# Patient Record
Sex: Female | Born: 1990 | Race: Black or African American | Hispanic: No | Smoking: Never smoker
Health system: Southern US, Community
[De-identification: ages and names within clinical notes are randomized; demographics above are authoritative.]

## PROBLEM LIST (undated history)

## (undated) ENCOUNTER — Inpatient Hospital Stay (HOSPITAL_COMMUNITY): Payer: Self-pay

## (undated) DIAGNOSIS — Z3490 Encounter for supervision of normal pregnancy, unspecified, unspecified trimester: Secondary | ICD-10-CM

## (undated) DIAGNOSIS — D649 Anemia, unspecified: Secondary | ICD-10-CM

## (undated) DIAGNOSIS — Z789 Other specified health status: Secondary | ICD-10-CM

## (undated) DIAGNOSIS — H9319 Tinnitus, unspecified ear: Secondary | ICD-10-CM

## (undated) DIAGNOSIS — F32A Depression, unspecified: Secondary | ICD-10-CM

## (undated) DIAGNOSIS — F419 Anxiety disorder, unspecified: Secondary | ICD-10-CM

## (undated) DIAGNOSIS — F329 Major depressive disorder, single episode, unspecified: Secondary | ICD-10-CM

## (undated) HISTORY — DX: Depression, unspecified: F32.A

## (undated) HISTORY — DX: Tinnitus, unspecified ear: H93.19

## (undated) HISTORY — PX: OTHER SURGICAL HISTORY: SHX169

## (undated) HISTORY — DX: Anxiety disorder, unspecified: F41.9

## (undated) HISTORY — PX: DILATION AND CURETTAGE OF UTERUS: SHX78

## (undated) HISTORY — DX: Anemia, unspecified: D64.9

## (undated) HISTORY — DX: Other specified health status: Z78.9

---
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
## 2012-07-09 ENCOUNTER — Emergency Department (HOSPITAL_COMMUNITY)
Admission: EM | Admit: 2012-07-09 | Discharge: 2012-07-09 | Disposition: A | Discharge status: Home / Self Care | Attending: Emergency Medicine | Admitting: Emergency Medicine

## 2012-07-09 ENCOUNTER — Encounter (HOSPITAL_COMMUNITY): Payer: Self-pay | Admitting: Physical Medicine and Rehabilitation

## 2012-07-09 DIAGNOSIS — Z3202 Encounter for pregnancy test, result negative: Secondary | ICD-10-CM | POA: Insufficient documentation

## 2012-07-09 DIAGNOSIS — R112 Nausea with vomiting, unspecified: Secondary | ICD-10-CM | POA: Insufficient documentation

## 2012-07-09 DIAGNOSIS — K529 Noninfective gastroenteritis and colitis, unspecified: Secondary | ICD-10-CM

## 2012-07-09 DIAGNOSIS — R197 Diarrhea, unspecified: Secondary | ICD-10-CM | POA: Insufficient documentation

## 2012-07-09 DIAGNOSIS — K5289 Other specified noninfective gastroenteritis and colitis: Secondary | ICD-10-CM | POA: Insufficient documentation

## 2012-07-09 LAB — URINALYSIS, ROUTINE W REFLEX MICROSCOPIC
Bilirubin Urine: NEGATIVE
Glucose, UA: NEGATIVE mg/dL
Ketones, ur: NEGATIVE mg/dL
Nitrite: NEGATIVE
pH: 5 (ref 5.0–8.0)

## 2012-07-09 LAB — CBC WITH DIFFERENTIAL/PLATELET
Basophils Relative: 0 % (ref 0–1)
Eosinophils Absolute: 0.1 10*3/uL (ref 0.0–0.7)
Eosinophils Relative: 1 % (ref 0–5)
HCT: 37.4 % (ref 36.0–46.0)
Hemoglobin: 13.3 g/dL (ref 12.0–15.0)
MCH: 30.5 pg (ref 26.0–34.0)
MCHC: 35.6 g/dL (ref 30.0–36.0)
Monocytes Absolute: 0.4 10*3/uL (ref 0.1–1.0)
Monocytes Relative: 6 % (ref 3–12)

## 2012-07-09 LAB — COMPREHENSIVE METABOLIC PANEL
Albumin: 3.8 g/dL (ref 3.5–5.2)
BUN: 10 mg/dL (ref 6–23)
Creatinine, Ser: 0.76 mg/dL (ref 0.50–1.10)
Total Protein: 7.4 g/dL (ref 6.0–8.3)

## 2012-07-09 LAB — LIPASE, BLOOD: Lipase: 79 U/L — ABNORMAL HIGH (ref 11–59)

## 2012-07-09 LAB — POCT PREGNANCY, URINE: Preg Test, Ur: NEGATIVE

## 2012-07-09 MED ORDER — ONDANSETRON 4 MG PO TBDP
8.0000 mg | ORAL_TABLET | Freq: Once | ORAL | Status: AC
  Administered 2012-07-09: 8 mg via ORAL
  Filled 2012-07-09: qty 2

## 2012-07-09 MED ORDER — DIPHENOXYLATE-ATROPINE 2.5-0.025 MG PO TABS: 1.0000 | ORAL_TABLET | Freq: Four times a day (QID) | ORAL | Status: DC | PRN

## 2012-07-09 MED ORDER — DIPHENOXYLATE-ATROPINE 2.5-0.025 MG PO TABS
1.0000 | ORAL_TABLET | Freq: Once | ORAL | Status: AC
  Administered 2012-07-09: 1 via ORAL
  Filled 2012-07-09: qty 1

## 2012-07-09 MED ORDER — PROMETHAZINE HCL 25 MG PO TABS: 25.0000 mg | ORAL_TABLET | Freq: Four times a day (QID) | ORAL | Status: DC | PRN

## 2012-07-09 NOTE — ED Notes (Signed)
Discharge instructions and medications reviewed. Pt verbalized understanding.  

## 2012-07-09 NOTE — ED Provider Notes (Addendum)
History     CSN: [id number]  Arrival date & time 07/09/12  1405   First MD Initiated Contact with Patient 07/09/12 1521      Chief Complaint  Patient presents with  . Nausea  . Emesis  . Abdominal Pain    (Consider location/radiation/quality/duration/timing/severity/associated sxs/prior treatment) HPI...... nausea vomiting and diarrhea for 12 hours.  Minimal lower abdominal pain. Not sexually active. Severity is mild. Patient is able to keep fluids down. No fever, dysuria, right lower quadrant tenderness..  nothing makes symptoms better or worse  No past medical history on file.  No past surgical history on file.  No family history on file.  History  Substance Use Topics  . Smoking status: Never Smoker   . Smokeless tobacco: Not on file  . Alcohol Use: No    OB History   Grav Para Term Preterm Abortions TAB SAB Ect Mult Living                  Review of Systems  All other systems reviewed and are negative.    Allergies  Review of patient's allergies indicates no known allergies.  Home Medications   Current Outpatient Rx  Name  Route  Sig  Dispense  Refill  . diphenoxylate-atropine (LOMOTIL) 2.5-0.025 MG per tablet   Oral   Take 1 tablet by mouth 4 (four) times daily as needed for diarrhea or loose stools.   15 tablet   0   . promethazine (PHENERGAN) 25 MG tablet   Oral   Take 1 tablet (25 mg total) by mouth every 6 (six) hours as needed for nausea.   15 tablet   0     BP 129/89  Pulse 95  Temp(Src) 99.8 F (37.7 C) (Oral)  Resp 18  SpO2 99%  Physical Exam  Nursing note and vitals reviewed. Constitutional: She is oriented to person, place, and time. She appears well-developed and well-nourished.  Patient does not appear ill  HENT:  Head: Normocephalic and atraumatic.  Eyes: Conjunctivae and EOM are normal. Pupils are equal, round, and reactive to light.  Neck: Normal range of motion. Neck supple.  Cardiovascular: Normal rate, regular  rhythm and normal heart sounds.   Pulmonary/Chest: Effort normal and breath sounds normal.  Abdominal: Soft. Bowel sounds are normal.  Musculoskeletal: Normal range of motion.  Neurological: She is alert and oriented to person, place, and time.  Skin: Skin is warm and dry.  Psychiatric: She has a normal mood and affect.    ED Course  Procedures (including critical care time)  Labs Reviewed  COMPREHENSIVE METABOLIC PANEL - Abnormal; Notable for the following:    Glucose, Bld 103 (*)    All other components within normal limits  LIPASE, BLOOD - Abnormal; Notable for the following:    Lipase 79 (*)    All other components within normal limits  URINALYSIS, ROUTINE W REFLEX MICROSCOPIC  CBC WITH DIFFERENTIAL  POCT PREGNANCY, URINE   No results found.   1. Gastroenteritis       MDM  Patient has normal vital signs. She does not appear ill. Abdomen is soft. Urinalysis and pregnancy test negative.  Slight elevation of lipase noted. She is a nondrinker. No epigastric tenderness.  Discharge meds Phenergan 25 mg #15 and Lomotil #15        Donnetta Hutching, MD 07/09/12 1627  Donnetta Hutching, MD 07/09/12 1627

## 2012-07-09 NOTE — ED Notes (Signed)
Pt presents to department for evaluation of lower abdominal pain and N/V/D. Onset this morning. 3/10 pain at the time. Last BM this morning was runny. Pt is conscious alert and oriented x4.

## 2012-07-09 NOTE — ED Notes (Signed)
Pt c/o lower abd pain, vomiting and diarrhea that started last night possibly after eating chinese food. Dr. Adriana Simas at bedside.

## 2013-06-06 DIAGNOSIS — S52209G Unspecified fracture of shaft of unspecified ulna, subsequent encounter for closed fracture with delayed healing: Secondary | ICD-10-CM | POA: Insufficient documentation

## 2013-11-29 ENCOUNTER — Ambulatory Visit (INDEPENDENT_AMBULATORY_CARE_PROVIDER_SITE_OTHER): Admitting: Obstetrics & Gynecology

## 2013-11-29 ENCOUNTER — Encounter: Payer: Self-pay | Admitting: Obstetrics & Gynecology

## 2013-11-29 VITALS — BP 132/82 | HR 79 | Temp 98.5°F | Ht 63.0 in | Wt 176.0 lb

## 2013-11-29 DIAGNOSIS — Z3401 Encounter for supervision of normal first pregnancy, first trimester: Secondary | ICD-10-CM

## 2013-11-29 DIAGNOSIS — Z3201 Encounter for pregnancy test, result positive: Secondary | ICD-10-CM

## 2013-11-29 DIAGNOSIS — Z3400 Encounter for supervision of normal first pregnancy, unspecified trimester: Secondary | ICD-10-CM | POA: Insufficient documentation

## 2013-11-29 NOTE — Patient Instructions (Signed)
AFP Maternal This is a routine screen (tests) used to check for fetal abnormalities such as Down syndrome and neural tube defects. Down syndrome is a chromosomal abnormality, sometimes called Trisomy 73. Neural tube defects are serious birth defects. The brain, spinal cord, or their coverings do not develop completely. Women should be tested in the 15th to 20th week of pregnancy. The msAFP screen involves three or four tests that measure substances found in the blood that make the testing better. During development, AFP levels in fetal blood and amniotic fluid rise until about 12 weeks. The levels then gradually fall until birth. AFP is a protein produce by fetal tissue. AFP crosses the placenta and appears in the maternal blood. A baby with an open neural tube defect has an opening in its spine, head, or abdominal wall that allows higher than usual amounts of AFP to pass into the mother's blood. If a screen is positive, more tests are needed to make a diagnosis. These include ultrasound and perhaps amniocentesis (checking the fluid that surrounds the baby). These tests are used to help women and their caregivers make decisions about the management of their pregnancies. In pregnancies where the fetus is carrying the chromosomal defect that results in Down syndrome, the levels of AFP and unconjugated estriol tend to be low and hCG and inhibin A levels high.  PREPARATION FOR TEST Blood is drawn from a vein in your arm usually between the 15th and 20th weeks of pregnancy. Four different tests on your blood are done. These are AFP, hCG, unconjugated estriol, and inhibin A. The combination of tests produces a more accurate result. NORMAL FINDINGS   Adult: less than 40 ng/mL or less than 40 mg/L (SI units).  Child younger than 1 year: less than 30 ng/mL. Ranges are stratified by weeks of gestation and vary among laboratories. Ranges for normal findings may vary among different laboratories and hospitals. You  should always check with your doctor after having lab work or other tests done to discuss the meaning of your test results and whether your values are considered within normal limits. MEANING OF TEST  These are screening tests. Not all fetal abnormalities will give positive test results. Of all women who have positive AFP screening results, only a very small number of them have babies who actually have a neural tube defect or chromosomal abnormality. Your caregiver will go over the test results with you and discuss the importance and meaning of your results, as well as treatment options and the need for additional tests if necessary. OBTAINING THE TEST RESULTS It is your responsibility to obtain your test results. Ask the lab or department performing the test when and how you will get your results. Document Released: 04/07/2004 Document Revised: 07/31/2013 Document Reviewed: 02/18/2008 [facility] Patient Information 2015 [place], [place]. This information is not intended to replace advice given to you by your health care provider. Make sure you discuss any questions you have with your health care provider. Pregnancy, The Father's Role A father has an important role during their partners pregnancy, labor, delivery and afterward. It is important to help and support your partner through this new period. There are many physical and emotional changes that happen. To be helpful and supportive during this time, you should know and understand what is happening to your partner during the pregnancy, labor, delivery and postpartum period.  PREGNANCY Pregnancy lasts 40 weeks (plus or minus 2 weeks). The pregnancy is divided into three trimesters.   In the first  13 weeks, the mother feels tired, has painful breasts, may feel sick to her stomach (nauseated), throw up (vomit), urinates more often and may have mood changes. All of these changes are normal. If the father is aware of these, he can be more helpful, supportive  and understanding. This may include helping with household duties and activities and spending more time with each other.  In the next 14 to 28 weeks, your partner is over the tiredness, nausea and vomiting. She will likely feel better and more energetic. This is the best time of the pregnancy to be more active together, go out more often or take trips. You will be able to see her belly popping out with the pregnancy. You may be able to feel the baby kick.  In the last 12 weeks, she may become more uncomfortable again because her abdomen is popping out more as the baby grows. She may have a hard time doing household chores, her balance may be off, she may have a hard time bending over, tires easily and has a tough time sleeping. At this time, you will realize the birth of your baby is close. You and your partner may have concerns about the safety of your partner and if the baby will be normal and healthy. These are all normal and natural feelings. You should talk with each other and your caregiver if you have any questions. Attend prenatal care visits with your partner. This is a good time for you to get to know your caregiver, follow the pregnancy and ask questions. Prenatal visits are once a month for 6 months, then they are every 2 weeks for 2 months and then once a week the last month. You may have more prenatal visits if your caregiver feels it is needed. Your caregiver usually does an ultrasound of the baby at one of the prenatal visits or more often if needed. It is an exciting and emotional to see the baby moving and the heart beating.  Fathers can experience emotional changes during this time as well. These emotions can include happiness, excitement and feeling proud. Fathers may also be concerned about having new responsibilities. These include financial, educational and if it will change the relationship with his partner. These feelings are normal. They should be talked about openly and positively  with each other. An important and often asked question is if sexual intercourse is safe during pregnancy and if it will harm the baby. Sexual intercourse is safe unless there is a problem with the pregnancy and your caregiver advises you to not have sexual intercourse. Because physical and emotional changes happen in pregnancy, your partner may not want to have sex during certain times. This is mostly true in the first and third trimesters. Trying different positions may make sexual intercourse comfortable. It is important for the both of you to discuss your feelings and desires with this problem. Talk to your caregiver about any questions you have about sexual intercourse during the pregnancy. LABOR AND DELIVERY There are childbirth classes available for couples to take together. They help you understand what happens during labor and delivery. They also teach you how to help your partner with her labor pains, how to relax, breath properly during a contraction and focus on what is happening during labor. You may be asked to time the contractions, massage her back and breath with her during the contractions. You are also there to see and enjoy the excitement your baby being born. If you have any  feelings of fainting or are uncomfortable, tell someone to help you. You may be asked to leave the room if a problem develops during the labor or delivery. Sometimes a Cesarean Section (C-section) is scheduled or is an emergency during labor and delivery. A C-section is a major operation to deliver the baby. It is done through an incision in the abdomen and uterus. Your partner will be given a medicine to make her sleep (general anesthesia) or spinal anesthesia (numbing the body from the waist down). Most hospitals allow the father in the room for a C-section unless it is an emergency. Recovery from a C-section takes longer, is more uncomfortable and will require more help from the father. AFTER DELIVERY After the baby  is born, the mother goes through many changes again. These changes could last 4 to 6 weeks or longer following a C-section. It is not unusual to be anxious, concerned and afraid that you may not be taking care of your newborn baby properly. Your partner may take a while to regain her strength. She may also get feelings of sadness (postpartum blues or depression), which is a more serious condition that may require medical treatment.  Your partner may decide to breastfeed the baby. This helps with bonding between the mother and the baby. Breastfeeding is the best way to feed the baby, but you may feel "left out." However, you can feel included by burping the baby and bottle feeding the baby with breast milk (collected by the mother) to give your partner some rest. This also helps you to bond with the baby. Breastfeeding mothers can get pregnant even if they are not having menstrual periods. Therefore, some form of birth control should be used if you do not want to get pregnant. Another question and concern is when it is safe to have sexual intercourse again. Usually it takes 4 to 6 weeks for healing to be over with. It may take longer after a C-section. If you have any questions about having sexual intercourse or if it is painful, talk to your caregiver. As a father, you will be adjusting your role as the baby grows. Fatherhood is a on-going [employer]. You and your partner should still make time to be together alone and be the couple you were before the baby was born. This is helpful for you, your partner and your baby. As you can see, it is important for a father to be helpful, understanding and supportive during this special time. Document Released: 09/02/2007 Document Revised: 06/08/2011 Document Reviewed: 09/02/2007 [facility] Patient Information 2015 [place], [place]. This information is not intended to replace advice given to you by your health care provider. Make sure you discuss any questions you  have with your health care provider. Second Trimester of Pregnancy The second trimester is from week 13 through week 28, months 4 through 6. The second trimester is often a time when you feel your best. Your body has also adjusted to being pregnant, and you begin to feel better physically. Usually, morning sickness has lessened or quit completely, you may have more energy, and you may have an increase in appetite. The second trimester is also a time when the fetus is growing rapidly. At the end of the sixth month, the fetus is about 9 inches long and weighs about 1 pounds. You will likely begin to feel the baby move (quickening) between 18 and 20 weeks of the pregnancy. BODY CHANGES Your body goes through many changes during pregnancy. The changes vary  from woman to woman.   Your weight will continue to increase. You will notice your lower abdomen bulging out.  You may begin to get stretch marks on your hips, abdomen, and breasts.  You may develop headaches that can be relieved by medicines approved by your health care provider.  You may urinate more often because the fetus is pressing on your bladder.  You may develop or continue to have heartburn as a result of your pregnancy.  You may develop constipation because certain hormones are causing the muscles that push waste through your intestines to slow down.  You may develop hemorrhoids or swollen, bulging veins (varicose veins).  You may have back pain because of the weight gain and pregnancy hormones relaxing your joints between the bones in your pelvis and as a result of a shift in weight and the muscles that support your balance.  Your breasts will continue to grow and be tender.  Your gums may bleed and may be sensitive to brushing and flossing.  Dark spots or blotches (chloasma, mask of pregnancy) may develop on your face. This will likely fade after the baby is born.  A dark line from your belly button to the pubic area (linea  nigra) may appear. This will likely fade after the baby is born.  You may have changes in your hair. These can include thickening of your hair, rapid growth, and changes in texture. Some women also have hair loss during or after pregnancy, or hair that feels dry or thin. Your hair will most likely return to normal after your baby is born. WHAT TO EXPECT AT YOUR PRENATAL VISITS During a routine prenatal visit:  You will be weighed to make sure you and the fetus are growing normally.  Your blood pressure will be taken.  Your abdomen will be measured to track your baby's growth.  The fetal heartbeat will be listened to.  Any test results from the previous visit will be discussed. Your health care provider may ask you:  How you are feeling.  If you are feeling the baby move.  If you have had any abnormal symptoms, such as leaking fluid, bleeding, severe headaches, or abdominal cramping.  If you have any questions. Other tests that may be performed during your second trimester include:  Blood tests that check for:  Low iron levels (anemia).  Gestational diabetes (between 24 and 28 weeks).  Rh antibodies.  Urine tests to check for infections, diabetes, or protein in the urine.  An ultrasound to confirm the proper growth and development of the baby.  An amniocentesis to check for possible genetic problems.  Fetal screens for spina bifida and Down syndrome. HOME CARE INSTRUCTIONS   Avoid all smoking, herbs, alcohol, and unprescribed drugs. These chemicals affect the formation and growth of the baby.  Follow your health care provider's instructions regarding medicine use. There are medicines that are either safe or unsafe to take during pregnancy.  Exercise only as directed by your health care provider. Experiencing uterine cramps is a good sign to stop exercising.  Continue to eat regular, healthy meals.  Wear a good support bra for breast tenderness.  Do not use hot  tubs, steam rooms, or saunas.  Wear your seat belt at all times when driving.  Avoid raw meat, uncooked cheese, cat litter boxes, and soil used by cats. These carry germs that can cause birth defects in the baby.  Take your prenatal vitamins.  Try taking a stool softener (if your  health care provider approves) if you develop constipation. Eat more high-fiber foods, such as fresh vegetables or fruit and whole grains. Drink plenty of fluids to keep your urine clear or pale yellow.  Take warm sitz baths to soothe any pain or discomfort caused by hemorrhoids. Use hemorrhoid cream if your health care provider approves.  If you develop varicose veins, wear support hose. Elevate your feet for 15 minutes, 3-4 times a day. Limit salt in your diet.  Avoid heavy lifting, wear low heel shoes, and practice good posture.  Rest with your legs elevated if you have leg cramps or low back pain.  Visit your dentist if you have not gone yet during your pregnancy. Use a soft toothbrush to brush your teeth and be gentle when you floss.  A sexual relationship may be continued unless your health care provider directs you otherwise.  Continue to go to all your prenatal visits as directed by your health care provider. SEEK MEDICAL CARE IF:   You have dizziness.  You have mild pelvic cramps, pelvic pressure, or nagging pain in the abdominal area.  You have persistent nausea, vomiting, or diarrhea.  You have a bad smelling vaginal discharge.  You have pain with urination. SEEK IMMEDIATE MEDICAL CARE IF:   You have a fever.  You are leaking fluid from your vagina.  You have spotting or bleeding from your vagina.  You have severe abdominal cramping or pain.  You have rapid weight gain or loss.  You have shortness of breath with chest pain.  You notice sudden or extreme swelling of your face, hands, ankles, feet, or legs.  You have not felt your baby move in over an hour.  You have severe  headaches that do not go away with medicine.  You have vision changes. Document Released: 03/10/2001 Document Revised: 03/21/2013 Document Reviewed: 05/17/2012 [facility] Patient Information 2015 [place], [place]. This information is not intended to replace advice given to you by your health care provider. Make sure you discuss any questions you have with your health care provider.

## 2013-11-29 NOTE — Progress Notes (Signed)
Subjective:    Tina [name] is being seen today for her first obstetrical visit.  This is not a planned pregnancy. She is at [redacted]w[redacted]d gestation. Relationship with FOB: significant other, not living together. Patient does intend to breast feed. Pregnancy history fully reviewed.  The information documented in the HPI was reviewed and verified.  Menstrual History: OB History   Grav Para Term Preterm Abortions TAB SAB Ect Mult Living   2    1            Patient's last menstrual period was 09/09/2013.    Past Medical History  Diagnosis Date  . Medical history non-contributory     Past Surgical History  Procedure Laterality Date  . Nasal cyst removal    . Right arm surgery      Rod and screws placed     (Not in a hospital admission) No Known Allergies  History  Substance Use Topics  . Smoking status: Never Smoker   . Smokeless tobacco: Never Used  . Alcohol Use: No    Family History  Problem Relation Age of Onset  . Cancer Mother   . Diabetes Father   . Hypertension Father      Review of Systems Constitutional: negative for weight loss Gastrointestinal: negative for vomiting Genitourinary:negative for genital lesions and vaginal discharge and dysuria Musculoskeletal:negative for back pain Behavioral/Psych: negative for abusive relationship, depression, illegal drug usage and tobacco use    Objective:    BP 132/82  Pulse 79  Temp(Src) 98.5 F (36.9 C)  Ht  (1.6 m)  Wt 79.833 kg (176 lb)  BMI 31.18 kg/m2  LMP 09/09/2013 General Appearance:    Alert, cooperative, no distress, appears stated age  Head:    Normocephalic, without obvious abnormality, atraumatic  Eyes:    PERRL, conjunctiva/corneas clear, EOM's intact, fundi    benign, both eyes  Ears:    Normal TM's and external ear canals, both ears  Nose:   Nares normal, septum midline, mucosa normal, no drainage    or sinus tenderness  Throat:   Lips, mucosa, and tongue normal; teeth and gums normal  Neck:    Supple, symmetrical, trachea midline, no adenopathy;    thyroid:  no enlargement/tenderness/nodules; no carotid   bruit or JVD  Back:     Symmetric, no curvature, ROM normal, no CVA tenderness  Lungs:     Clear to auscultation bilaterally, respirations unlabored  Chest Wall:    No tenderness or deformity   Heart:    Regular rate and rhythm, S1 and S2 normal, no murmur, rub   or gallop  Breast Exam:    No tenderness, masses, or nipple abnormality  Abdomen:     Soft, non-tender, bowel sounds active all four quadrants,    no masses, no organomegaly  Genitalia:    Normal female without lesion, discharge or tenderness  Extremities:   Extremities normal, atraumatic, no cyanosis or edema  Pulses:   2+ and symmetric all extremities  Skin:   Skin color, texture, turgor normal, no rashes or lesions  Lymph nodes:   Cervical, supraclavicular, and axillary nodes normal  Neurologic:   CNII-XII intact, normal strength, sensation and reflexes    throughout      Lab Review Urine pregnancy test Labs reviewed no Radiologic studies reviewed no Assessment:    Pregnancy at [redacted]w[redacted]d weeks    Plan:      Prenatal vitamins.  Counseling provided regarding continued use of seat belts, cessation of alcohol  consumption, smoking or use of illicit drugs; infection precautions i.e., influenza/TDAP immunizations, toxoplasmosis,CMV, parvovirus, listeria and varicella; workplace safety, exercise during pregnancy; routine dental care, safe medications, sexual activity, hot tubs, saunas, pools, travel, caffeine use, fish and methlymercury, potential toxins, hair treatments, varicose veins Weight gain recommendations per IOM guidelines reviewed: obese/BMI >30->gain  11 - 20 lbs Problem list reviewed and updated. FIRST/CF mutation testing/NIPT/QUAD SCREEN/fragile X/Spinal muscular atrophy discussed Role of ultrasound in pregnancy discussed Amniocentesis discussed: not indicated.  Meds ordered this encounter   Medications  . Prenatal Multivit-Min-Fe-FA (PRENATAL VITAMINS PO)    Sig: Take by mouth.   Orders Placed This Encounter  Procedures  . Culture, OB Urine  . Obstetric panel  . HIV antibody  . Hemoglobinopathy evaluation  . Varicella zoster antibody, IgG  . Vit D  25 hydroxy (rtn osteoporosis monitoring)  . TSH  . POCT urinalysis dipstick    Follow up in 4 weeks.

## 2013-11-30 LAB — OBSTETRIC PANEL
ANTIBODY SCREEN: NEGATIVE
BASOS ABS: 0 10*3/uL (ref 0.0–0.1)
Basophils Relative: 0 % (ref 0–1)
EOS ABS: 0 10*3/uL (ref 0.0–0.7)
EOS PCT: 0 % (ref 0–5)
HEMATOCRIT: 37.8 % (ref 36.0–46.0)
Hemoglobin: 12.5 g/dL (ref 12.0–15.0)
Hepatitis B Surface Ag: NEGATIVE
LYMPHS ABS: 1.6 10*3/uL (ref 0.7–4.0)
LYMPHS PCT: 12 % (ref 12–46)
MCH: 29.3 pg (ref 26.0–34.0)
MCHC: 33.1 g/dL (ref 30.0–36.0)
MCV: 88.5 fL (ref 78.0–100.0)
MONO ABS: 0.7 10*3/uL (ref 0.1–1.0)
Monocytes Relative: 5 % (ref 3–12)
Neutro Abs: 11.2 10*3/uL — ABNORMAL HIGH (ref 1.7–7.7)
Neutrophils Relative %: 83 % — ABNORMAL HIGH (ref 43–77)
Platelets: 275 10*3/uL (ref 150–400)
RBC: 4.27 MIL/uL (ref 3.87–5.11)
RDW: 13.1 % (ref 11.5–15.5)
RUBELLA: 6.07 {index} — AB (ref <0.90)
Rh Type: POSITIVE
WBC: 13.5 10*3/uL — AB (ref 4.0–10.5)

## 2013-11-30 LAB — WET PREP BY MOLECULAR PROBE
CANDIDA SPECIES: NEGATIVE
Gardnerella vaginalis: POSITIVE — AB
Trichomonas vaginosis: NEGATIVE

## 2013-11-30 LAB — VITAMIN D 25 HYDROXY (VIT D DEFICIENCY, FRACTURES): VIT D 25 HYDROXY: 27 ng/mL — AB (ref 30–89)

## 2013-11-30 LAB — TSH: TSH: 0.444 u[IU]/mL (ref 0.350–4.500)

## 2013-11-30 LAB — HEMOGLOBIN A1C
Hgb A1c MFr Bld: 5.5 % (ref <5.7)
MEAN PLASMA GLUCOSE: 111 mg/dL (ref <117)

## 2013-11-30 LAB — HIV ANTIBODY (ROUTINE TESTING W REFLEX): HIV: NONREACTIVE

## 2013-11-30 LAB — VARICELLA ZOSTER ANTIBODY, IGG: Varicella IgG: 2507 Index — ABNORMAL HIGH (ref <135.00)

## 2013-12-01 LAB — PAP IG AND CT-NG NAA
CHLAMYDIA PROBE AMP: NEGATIVE
GC PROBE AMP: NEGATIVE

## 2013-12-01 LAB — HEMOGLOBINOPATHY EVALUATION
HGB A: 96.9 % (ref 96.8–97.8)
Hemoglobin Other: 0 %
Hgb A2 Quant: 2.5 % (ref 2.2–3.2)
Hgb F Quant: 0.6 % (ref 0.0–2.0)
Hgb S Quant: 0 %

## 2013-12-02 ENCOUNTER — Encounter: Payer: Self-pay | Admitting: Obstetrics & Gynecology

## 2013-12-02 DIAGNOSIS — IMO0002 Reserved for concepts with insufficient information to code with codable children: Secondary | ICD-10-CM | POA: Insufficient documentation

## 2013-12-02 NOTE — Progress Notes (Signed)
Quick Note:  Needs PNV w/vitamin D. Inform her of pap result. If last pap was normal-->repeat in 1 yr. If last pap was abnormal-->colpo post partum ______

## 2013-12-20 ENCOUNTER — Encounter: Payer: Self-pay | Admitting: *Deleted

## 2013-12-21 ENCOUNTER — Telehealth: Payer: Self-pay | Admitting: *Deleted

## 2013-12-21 ENCOUNTER — Other Ambulatory Visit: Payer: Self-pay | Admitting: *Deleted

## 2013-12-21 DIAGNOSIS — B9689 Other specified bacterial agents as the cause of diseases classified elsewhere: Secondary | ICD-10-CM

## 2013-12-21 DIAGNOSIS — N760 Acute vaginitis: Principal | ICD-10-CM

## 2013-12-21 MED ORDER — METRONIDAZOLE 500 MG PO TABS: 500.0000 mg | ORAL_TABLET | Freq: Two times a day (BID) | ORAL | Status: DC

## 2013-12-21 NOTE — Telephone Encounter (Signed)
Patient was in the office to pick up letter- she states that in the evening she feels her heart racing. This is the only time that she feels this- at no other time in the day. She states she can slow it most of the time I she concentrates. Do we need a baseline EKG on her?

## 2013-12-23 NOTE — Telephone Encounter (Signed)
Yes.  Along w/TSH, free T4 and BMET

## 2013-12-26 NOTE — Telephone Encounter (Signed)
Patient notified of response. She has an appointment in the office tomorrow and will speak to the doctor about testing that should be done.

## 2013-12-27 ENCOUNTER — Encounter: Payer: Self-pay | Admitting: Obstetrics & Gynecology

## 2013-12-27 ENCOUNTER — Telehealth: Payer: Self-pay

## 2013-12-27 ENCOUNTER — Encounter: Admitting: Obstetrics & Gynecology

## 2013-12-27 ENCOUNTER — Ambulatory Visit (INDEPENDENT_AMBULATORY_CARE_PROVIDER_SITE_OTHER): Admitting: Obstetrics & Gynecology

## 2013-12-27 VITALS — BP 120/65 | HR 82 | Temp 98.4°F | Wt 178.0 lb

## 2013-12-27 DIAGNOSIS — Z3400 Encounter for supervision of normal first pregnancy, unspecified trimester: Secondary | ICD-10-CM

## 2013-12-27 DIAGNOSIS — Z3402 Encounter for supervision of normal first pregnancy, second trimester: Secondary | ICD-10-CM

## 2013-12-27 LAB — POCT URINALYSIS DIPSTICK
Bilirubin, UA: NEGATIVE
Blood, UA: NEGATIVE
Glucose, UA: NEGATIVE
Ketones, UA: NEGATIVE
LEUKOCYTES UA: NEGATIVE
NITRITE UA: NEGATIVE
PH UA: 7
PROTEIN UA: NEGATIVE
Spec Grav, UA: 1.01
UROBILINOGEN UA: NEGATIVE

## 2013-12-27 NOTE — Telephone Encounter (Signed)
[facility] at 10am

## 2013-12-28 LAB — URINE CULTURE
Colony Count: NO GROWTH
Organism ID, Bacteria: NO GROWTH

## 2013-12-28 LAB — BASIC METABOLIC PANEL
BUN: 6 mg/dL (ref 6–23)
CALCIUM: 8.8 mg/dL (ref 8.4–10.5)
CO2: 24 mEq/L (ref 19–32)
CREATININE: 0.62 mg/dL (ref 0.50–1.10)
Chloride: 104 mEq/L (ref 96–112)
GLUCOSE: 63 mg/dL — AB (ref 70–99)
POTASSIUM: 3.7 meq/L (ref 3.5–5.3)
Sodium: 133 mEq/L — ABNORMAL LOW (ref 135–145)

## 2013-12-29 NOTE — Patient Instructions (Signed)
AFP Maternal This is a routine screen (tests) used to check for fetal abnormalities such as Down syndrome and neural tube defects. Down syndrome is a chromosomal abnormality, sometimes called Trisomy 21. Neural tube defects are serious birth defects. The brain, spinal cord, or their coverings do not develop completely. Women should be tested in the 15th to 20th week of pregnancy. The msAFP screen involves three or four tests that measure substances found in the blood that make the testing better. During development, AFP levels in fetal blood and amniotic fluid rise until about 12 weeks. The levels then gradually fall until birth. AFP is a protein produce by fetal tissue. AFP crosses the placenta and appears in the maternal blood. A baby with an open neural tube defect has an opening in its spine, head, or abdominal wall that allows higher than usual amounts of AFP to pass into the mother's blood. If a screen is positive, more tests are needed to make a diagnosis. These include ultrasound and perhaps amniocentesis (checking the fluid that surrounds the baby). These tests are used to help women and their caregivers make decisions about the management of their pregnancies. In pregnancies where the fetus is carrying the chromosomal defect that results in Down syndrome, the levels of AFP and unconjugated estriol tend to be low and hCG and inhibin A levels high.  PREPARATION FOR TEST Blood is drawn from a vein in your arm usually between the 15th and 20th weeks of pregnancy. Four different tests on your blood are done. These are AFP, hCG, unconjugated estriol, and inhibin A. The combination of tests produces a more accurate result. NORMAL FINDINGS   Adult: less than 40 ng/mL or less than 40 mg/L (SI units).  Child younger than 1 year: less than 30 ng/mL. Ranges are stratified by weeks of gestation and vary among laboratories. Ranges for normal findings may vary among different laboratories and hospitals. You  should always check with your doctor after having lab work or other tests done to discuss the meaning of your test results and whether your values are considered within normal limits. MEANING OF TEST  These are screening tests. Not all fetal abnormalities will give positive test results. Of all women who have positive AFP screening results, only a very small number of them have babies who actually have a neural tube defect or chromosomal abnormality. Your caregiver will go over the test results with you and discuss the importance and meaning of your results, as well as treatment options and the need for additional tests if necessary. OBTAINING THE TEST RESULTS It is your responsibility to obtain your test results. Ask the lab or department performing the test when and how you will get your results. Document Released: 04/07/2004 Document Revised: 07/31/2013 Document Reviewed: 02/18/2008 ExitCare Patient Information 2015 [employer]. This information is not intended to replace advice given to you by your health care provider. Make sure you discuss any questions you have with your health care provider.  

## 2013-12-29 NOTE — Progress Notes (Signed)
  Subjective:    Tina ForestCharlise [name] is a 23 y.o. female being seen today for her obstetrical visit. She is at 640w4d gestation. Patient reports: no complaints.  Problem List Items Addressed This Visit     Unprioritized   Supervision of normal first pregnancy - Primary   Relevant Orders      POCT urinalysis dipstick (Completed)      Urine culture (Completed)      Basic Metabolic Panel (BMET) (Completed)      EKG 12-Lead      US OB Comp + 14 Wk     Patient Active Problem List   Diagnosis Date Noted  . ASCUS favor benign 12/02/2013  . Supervision of normal first pregnancy 11/29/2013    Objective:     BP 120/65  Pulse 82  Temp(Src) 98.4 F (36.9 C)  Wt 80.74 kg (178 lb)  LMP 09/09/2013 Uterine Size: Below umbilicus     Assessment:    Pregnancy @ 6040w4d weeks Doing well    Plan:    Problem list reviewed and updated. Labs reviewed.  Follow up in 4 weeks. CF mutation testing/NIPT/QUAD SCREEN/Ashkenazi Jewish population testing/Spinal muscular atrophy discussed. Role of ultrasound in pregnancy discussed. Amniocentesis discussed: not indicated.

## 2014-01-16 ENCOUNTER — Other Ambulatory Visit: Payer: Self-pay | Admitting: *Deleted

## 2014-01-16 ENCOUNTER — Ambulatory Visit (INDEPENDENT_AMBULATORY_CARE_PROVIDER_SITE_OTHER)

## 2014-01-16 DIAGNOSIS — Z3402 Encounter for supervision of normal first pregnancy, second trimester: Secondary | ICD-10-CM

## 2014-01-16 DIAGNOSIS — B379 Candidiasis, unspecified: Secondary | ICD-10-CM

## 2014-01-16 DIAGNOSIS — T3695XA Adverse effect of unspecified systemic antibiotic, initial encounter: Principal | ICD-10-CM

## 2014-01-16 LAB — US OB COMP + 14 WK

## 2014-01-16 MED ORDER — TERCONAZOLE 0.4 % VA CREA: 1.0000 | TOPICAL_CREAM | Freq: Every day | VAGINAL | Status: DC

## 2014-01-25 ENCOUNTER — Ambulatory Visit (INDEPENDENT_AMBULATORY_CARE_PROVIDER_SITE_OTHER): Admitting: Obstetrics & Gynecology

## 2014-01-25 ENCOUNTER — Other Ambulatory Visit: Payer: Self-pay | Admitting: Obstetrics & Gynecology

## 2014-01-25 ENCOUNTER — Encounter: Payer: Self-pay | Admitting: Obstetrics & Gynecology

## 2014-01-25 VITALS — BP 125/79 | HR 93 | Wt 183.0 lb

## 2014-01-25 DIAGNOSIS — Z3402 Encounter for supervision of normal first pregnancy, second trimester: Secondary | ICD-10-CM

## 2014-01-25 LAB — POCT URINALYSIS DIPSTICK
BILIRUBIN UA: NEGATIVE
GLUCOSE UA: NEGATIVE
Ketones, UA: NEGATIVE
Leukocytes, UA: NEGATIVE
Nitrite, UA: NEGATIVE
Protein, UA: NEGATIVE
RBC UA: NEGATIVE
SPEC GRAV UA: 1.02
Urobilinogen, UA: NEGATIVE
pH, UA: 5

## 2014-01-26 NOTE — Progress Notes (Signed)
Subjective:    Tina [name] is a 23 y.o. female being seen today for her obstetrical visit. She is at [redacted]w[redacted]d gestation. Patient reports: no complaints . Fetal movement: normal.  Problem List Items Addressed This Visit   None    Visit Diagnoses   Encounter for supervision of normal first pregnancy in second trimester    -  Primary    Relevant Orders       AFP, Quad Screen       Cystic fibrosis diagnostic study       Miscellaneous test      Patient Active Problem List   Diagnosis Date Noted  . ASCUS favor benign 12/02/2013  . Supervision of normal first pregnancy 11/29/2013   Objective:    BP 125/79  Pulse 93  Wt 83.008 kg (183 lb)  LMP 09/09/2013 FHT: 160 BPM  Uterine Size: size equals dates     Assessment:    Pregnancy @ 655w5d    Plan:    Signs and symptoms of preterm labor: discussed.  Orders Placed This Encounter  Procedures  . AFP, Quad Screen    Order Specific Question:  Repeat Sample    Answer:  No    Order Specific Question:  Maternal Race    Answer:  African American    Order Specific Question:  EDD    Answer:  06/16/2014    Order Specific Question:  Pregnancy Donor Egg (Y/N)    Answer:  No    Order Specific Question:  Gest Age at U/S (Wk.Dy)    Answer:  18.2 weeks    Order Specific Question:  Number of Fetuses    Answer:  1    Order Specific Question:  Ultrasound Date    Answer:  01/15/2014    Order Specific Question:  Hx of OSB/NTD?    Answer:  No    Order Specific Question:  History of Down Syndrome?    Answer:  No    Order Specific Question:  Maternal IDDM (insulin-dependent diabetes mellitus)    Answer:  No  . Cystic fibrosis diagnostic study    Order Specific Question:  Patient Ethnicity:    Answer:  African-American  . Miscellaneous test    Spinal Muscular Atrophy   2 hr GTT planned Follow up in 4 weeks.

## 2014-01-26 NOTE — Patient Instructions (Signed)
Alpha-Fetoprotein Alpha-Fetoprotein (AFP) is a protein made by the fetal liver. It is normally elevated in the newborn and the mother. In the infant, the value falls to adult values (normally less than 20 ng/ml (nanograms per milliliter) by one year of age. This protein is found in a number of abnormal tumors and conditions. It can be used for a screening test when this is appropriate. PREPARATION FOR TEST No preparation or fasting is required. ELEVATIONS OF AFP ARE CAUSED BY:  Primary hepatocellular carcinoma (cancer of the liver) and the level correlates with tumor size.  A screening test for embryonic teratocarcinomas, hepatoblastomas (tumor of the liver).  Rarely hepatic metastases (cancer spread) from the GI tract.  Some cholangiocarcinomas cause elevations greater than 400 ng/ml.  Rapidly progressing hepatitis can produce levels of AFP in excess of 1000ng/ml.  Lesser levels of hepatitis (inflammation of the liver) can produce levels of 100 to 400 ng/ml. NORMAL FINDINGS   Adult: Less than 40 ng/mL or Less than 40 mg/L (SI units).  Child younger than 1 year: Less than 30ng/mL. Ranges are stratified by weeks of gestation and vary among laboratories. Ranges for normal findings may vary among different laboratories and hospitals. You should always check with your doctor after having lab work or other tests done to discuss the meaning of your test results and whether your values are considered within normal limits. MEANING OF TEST  Your caregiver will go over the test results with you and discuss the importance and meaning of your results, as well as treatment options and the need for additional tests if necessary. OBTAINING THE TEST RESULTS  It is your responsibility to obtain your test results. Ask the lab or department performing the test when and how you will get your results. Document Released: 04/09/2004 Document Revised: 06/08/2011 Document Reviewed: 02/19/2008 ExitCare Patient  Information 2015 [employer]. This information is not intended to replace advice given to you by your health care provider. Make sure you discuss any questions you have with your health care provider.  

## 2014-01-29 ENCOUNTER — Encounter: Payer: Self-pay | Admitting: Obstetrics & Gynecology

## 2014-01-29 LAB — AFP, QUAD SCREEN
AFP: 63.1 ng/mL
Age Alone: 1:1040 {titer}
CURR GEST AGE: 19.5 wks.days
HCG, Total: 27.64 IU/mL
INH: 156.9 pg/mL
INTERPRETATION-AFP: NEGATIVE
MOM FOR INH: 0.95
MoM for AFP: 1.12
MoM for hCG: 1.33
Open Spina bifida: NEGATIVE
Osb Risk: 1:17600 {titer}
Tri 18 Scr Risk Est: NEGATIVE
Trisomy 18 (Edward) Syndrome Interp.: 1:118000 {titer}
UE3 MOM: 1.35
uE3 Value: 2.29 ng/mL

## 2014-01-29 LAB — CYSTIC FIBROSIS DIAGNOSTIC STUDY

## 2014-02-05 NOTE — Addendum Note (Signed)
Addended by: Henriette CombsHATTON, Mordechai Matuszak L on: 02/05/2014 10:42 AM   Modules accepted: Orders

## 2014-02-06 ENCOUNTER — Other Ambulatory Visit

## 2014-02-09 LAB — SPINAL MUSCULAR ATROPHY CARRIER

## 2014-02-28 ENCOUNTER — Encounter: Admitting: Obstetrics & Gynecology

## 2014-03-02 ENCOUNTER — Ambulatory Visit (INDEPENDENT_AMBULATORY_CARE_PROVIDER_SITE_OTHER): Admitting: Obstetrics & Gynecology

## 2014-03-02 VITALS — BP 107/69 | HR 92 | Temp 99.1°F | Wt 189.0 lb

## 2014-03-02 DIAGNOSIS — Z3483 Encounter for supervision of other normal pregnancy, third trimester: Secondary | ICD-10-CM

## 2014-03-02 DIAGNOSIS — Z3482 Encounter for supervision of other normal pregnancy, second trimester: Secondary | ICD-10-CM

## 2014-03-02 LAB — POCT URINALYSIS DIPSTICK
BILIRUBIN UA: NEGATIVE
Blood, UA: NEGATIVE
GLUCOSE UA: NEGATIVE
KETONES UA: NEGATIVE
Leukocytes, UA: NEGATIVE
Nitrite, UA: NEGATIVE
Protein, UA: NEGATIVE
SPEC GRAV UA: 1.01
Urobilinogen, UA: NEGATIVE
pH, UA: 7

## 2014-03-02 NOTE — Patient Instructions (Signed)

## 2014-03-03 LAB — CBC
HCT: 32.2 % — ABNORMAL LOW (ref 36.0–46.0)
Hemoglobin: 10.6 g/dL — ABNORMAL LOW (ref 12.0–15.0)
MCH: 29 pg (ref 26.0–34.0)
MCHC: 32.9 g/dL (ref 30.0–36.0)
MCV: 88 fL (ref 78.0–100.0)
MPV: 10.5 fL (ref 9.4–12.4)
PLATELETS: 250 10*3/uL (ref 150–400)
RBC: 3.66 MIL/uL — AB (ref 3.87–5.11)
RDW: 13.9 % (ref 11.5–15.5)
WBC: 16.5 10*3/uL — AB (ref 4.0–10.5)

## 2014-03-03 LAB — GLUCOSE TOLERANCE, 2 HOURS W/ 1HR
GLUCOSE, FASTING: 65 mg/dL — AB (ref 70–99)
GLUCOSE: 110 mg/dL (ref 70–170)
Glucose, 2 hour: 121 mg/dL (ref 70–139)

## 2014-03-03 LAB — RPR

## 2014-03-03 LAB — HIV ANTIBODY (ROUTINE TESTING W REFLEX): HIV 1&2 Ab, 4th Generation: NONREACTIVE

## 2014-03-04 NOTE — Progress Notes (Signed)
Subjective:    Tina ForestCharlise Huyser is a 23 y.o. female being seen today for her obstetrical visit. She is at 2938w6d gestation. Patient reports: no complaints . Fetal movement: normal.  Problem List Items Addressed This Visit    None    Visit Diagnoses    Encounter for supervision of other normal pregnancy in second trimester    -  Primary    Relevant Orders       Glucose Tolerance, 2 Hours w/1 Hour (Completed)       CBC (Completed)       HIV antibody (Completed)       RPR (Completed)       SureSwab Bacterial Vaginosis/itis    Encounter for supervision of other normal pregnancy in third trimester        Relevant Orders       POCT urinalysis dipstick (Completed)      Patient Active Problem List   Diagnosis Date Noted  . ASCUS favor benign 12/02/2013  . Supervision of normal first pregnancy 11/29/2013   Objective:    BP 107/69 mmHg  Pulse 92  Temp(Src) 99.1 F (37.3 C)  Wt 85.73 kg (189 lb)  LMP 09/09/2013 FHT: 160 BPM  Uterine Size: size equals dates     Assessment:    Pregnancy @ 5938w6d    Plan:    Signs and symptoms of preterm labor: discussed.  Orders Placed This Encounter  Procedures  . SureSwab Bacterial Vaginosis/itis  . Glucose Tolerance, 2 Hours w/1 Hour  . CBC  . HIV antibody  . RPR  . POCT urinalysis dipstick    Follow up in 4 weeks.

## 2014-03-08 ENCOUNTER — Other Ambulatory Visit: Payer: Self-pay | Admitting: *Deleted

## 2014-03-08 DIAGNOSIS — B379 Candidiasis, unspecified: Secondary | ICD-10-CM

## 2014-03-08 DIAGNOSIS — T3695XA Adverse effect of unspecified systemic antibiotic, initial encounter: Principal | ICD-10-CM

## 2014-03-08 MED ORDER — TERCONAZOLE 0.4 % VA CREA: 1.0000 | TOPICAL_CREAM | Freq: Every day | VAGINAL | Status: DC

## 2014-03-26 ENCOUNTER — Encounter: Payer: Self-pay | Admitting: *Deleted

## 2014-03-27 ENCOUNTER — Encounter: Payer: Self-pay | Admitting: Obstetrics & Gynecology

## 2014-03-30 NOTE — L&D Delivery Note (Signed)
Delivery Note At 12:42 AM a viable female was delivered via Vaginal, Spontaneous Delivery (Presentation: Right Occiput Anterior).  APGAR: 8, 9; weight 7 lb 2.8 oz (3255 g).   Placenta status: Intact, Spontaneous.  Cord: 3 vessels with the following complications: None.  Cord pH: none  Anesthesia: Epidural  Episiotomy: None Lacerations: 1st degree;Vaginal Suture Repair: 3.0 vicryl rapide Est. Blood Loss (mL): 350  Mom to postpartum.  Baby to Couplet care / Skin to Skin.  Remmi Armenteros A 06/25/2014, 2:13 AM

## 2014-04-04 ENCOUNTER — Ambulatory Visit (INDEPENDENT_AMBULATORY_CARE_PROVIDER_SITE_OTHER): Admitting: Obstetrics

## 2014-04-04 ENCOUNTER — Encounter: Payer: Self-pay | Admitting: Obstetrics

## 2014-04-04 VITALS — BP 126/76 | HR 101 | Wt 196.0 lb

## 2014-04-04 DIAGNOSIS — Z3403 Encounter for supervision of normal first pregnancy, third trimester: Secondary | ICD-10-CM

## 2014-04-04 LAB — POCT URINALYSIS DIPSTICK
Bilirubin, UA: NEGATIVE
Blood, UA: NEGATIVE
Glucose, UA: NEGATIVE
Ketones, UA: NEGATIVE
Leukocytes, UA: NEGATIVE
NITRITE UA: NEGATIVE
PROTEIN UA: NEGATIVE
Spec Grav, UA: 1.02
Urobilinogen, UA: NEGATIVE
pH, UA: 5

## 2014-04-04 NOTE — Progress Notes (Signed)
Subjective:    Tina ForestCharlise [name] is a 24 y.o. female being seen today for her obstetrical visit. She is at 277w4d gestation. Patient reports no complaints. Fetal movement: normal.  Problem List Items Addressed This Visit    Supervision of normal first pregnancy - Primary   Relevant Orders      POCT urinalysis dipstick (Completed)     Patient Active Problem List   Diagnosis Date Noted  . ASCUS favor benign 12/02/2013  . Supervision of normal first pregnancy 11/29/2013   Objective:    BP 126/76 mmHg  Pulse 101  Wt 196 lb (88.905 kg)  LMP 09/09/2013 FHT:  160 BPM  Uterine Size: size equals dates  Presentation: unsure     Assessment:    Pregnancy @ 407w4d weeks   Plan:     labs reviewed, problem list updated Consent signed. GBS sent TDAP offered  Rhogam given for RH negative Pediatrician: discussed. Infant feeding: plans to breastfeed. Maternity leave:not discussed. Cigarette smoking: never smoked. Orders Placed This Encounter  Procedures  . POCT urinalysis dipstick   No orders of the defined types were placed in this encounter.   Follow up in 2 Weeks.

## 2014-04-05 ENCOUNTER — Inpatient Hospital Stay (HOSPITAL_COMMUNITY): Admission: AD | Admit: 2014-04-05 | Source: Ambulatory Visit | Admitting: Obstetrics & Gynecology

## 2014-04-18 ENCOUNTER — Encounter: Payer: Self-pay | Admitting: Obstetrics

## 2014-04-18 ENCOUNTER — Ambulatory Visit (INDEPENDENT_AMBULATORY_CARE_PROVIDER_SITE_OTHER): Admitting: Obstetrics

## 2014-04-18 VITALS — BP 128/67 | HR 96 | Wt 199.0 lb

## 2014-04-18 DIAGNOSIS — Z3403 Encounter for supervision of normal first pregnancy, third trimester: Secondary | ICD-10-CM

## 2014-04-18 LAB — POCT URINALYSIS DIPSTICK
BILIRUBIN UA: NEGATIVE
Blood, UA: NEGATIVE
Glucose, UA: NEGATIVE
Ketones, UA: NEGATIVE
Leukocytes, UA: NEGATIVE
NITRITE UA: NEGATIVE
PH UA: 5.5
Protein, UA: NEGATIVE
Spec Grav, UA: 1.015
UROBILINOGEN UA: NEGATIVE

## 2014-04-18 NOTE — Progress Notes (Signed)
Subjective:    Tina ForestCharlise [name] is a 24 y.o. female being seen today for her obstetrical visit. She is at 375w4d gestation. Patient reports no complaints. Fetal movement: normal.  Problem List Items Addressed This Visit    Supervision of normal first pregnancy - Primary   Relevant Orders   POCT urinalysis dipstick (Completed)     Patient Active Problem List   Diagnosis Date Noted  . ASCUS favor benign 12/02/2013  . Supervision of normal first pregnancy 11/29/2013   Objective:    BP 128/67 mmHg  Pulse 96  Wt 199 lb (90.266 kg)  LMP 09/09/2013 FHT:  160 BPM  Uterine Size: size equals dates  Presentation: unsure     Assessment:    Pregnancy @ 545w4d weeks   Plan:     labs reviewed, problem list updated Consent signed. GBS sent TDAP offered  Rhogam given for RH negative Pediatrician: discussed. Infant feeding: plans to breastfeed. Maternity leave: not discussed. Cigarette smoking: never smoked. Orders Placed This Encounter  Procedures  . POCT urinalysis dipstick   No orders of the defined types were placed in this encounter.   Follow up in 2 Weeks.

## 2014-05-02 ENCOUNTER — Ambulatory Visit (INDEPENDENT_AMBULATORY_CARE_PROVIDER_SITE_OTHER): Admitting: Obstetrics

## 2014-05-02 ENCOUNTER — Encounter: Payer: Self-pay | Admitting: Obstetrics

## 2014-05-02 VITALS — BP 128/73 | HR 104 | Temp 98.8°F | Wt 201.0 lb

## 2014-05-02 DIAGNOSIS — Z3403 Encounter for supervision of normal first pregnancy, third trimester: Secondary | ICD-10-CM

## 2014-05-02 LAB — POCT URINALYSIS DIPSTICK
BILIRUBIN UA: NEGATIVE
Blood, UA: NEGATIVE
Glucose, UA: 50
Ketones, UA: NEGATIVE
LEUKOCYTES UA: NEGATIVE
Nitrite, UA: NEGATIVE
PH UA: 6
Spec Grav, UA: 1.02
Urobilinogen, UA: NEGATIVE

## 2014-05-03 NOTE — Progress Notes (Signed)
Subjective:    Tina [name] is a 24 y.o. female being seen today for her obstetrical visit. She is at 2517w5d gestation. Patient reports no complaints. Fetal movement: normal.  Problem List Items Addressed This Visit    Supervision of normal first pregnancy - Primary   Relevant Orders   POCT urinalysis dipstick (Completed)     Patient Active Problem List   Diagnosis Date Noted  . ASCUS favor benign 12/02/2013  . Supervision of normal first pregnancy 11/29/2013   Objective:    BP 128/73 mmHg  Pulse 104  Temp(Src) 98.8 F (37.1 C)  Wt 201 lb (91.173 kg)  LMP 09/09/2013 FHT:  160 BPM  Uterine Size: size equals dates  Presentation: unsure     Assessment:    Pregnancy @ 917w5d weeks   Plan:     labs reviewed, problem list updated Consent signed. GBS sent TDAP offered  Rhogam given for RH negative Pediatrician: discussed. Infant feeding: plans to breastfeed. Maternity leave: discussed. Cigarette smoking: never smoked. Orders Placed This Encounter  Procedures  . POCT urinalysis dipstick   No orders of the defined types were placed in this encounter.   Follow up in 2 Weeks.

## 2014-05-14 ENCOUNTER — Ambulatory Visit (INDEPENDENT_AMBULATORY_CARE_PROVIDER_SITE_OTHER): Admitting: Obstetrics

## 2014-05-14 VITALS — BP 136/78 | HR 120 | Temp 98.3°F | Wt 202.0 lb

## 2014-05-14 DIAGNOSIS — Z029 Encounter for administrative examinations, unspecified: Secondary | ICD-10-CM

## 2014-05-14 DIAGNOSIS — Z3403 Encounter for supervision of normal first pregnancy, third trimester: Secondary | ICD-10-CM

## 2014-05-14 LAB — POCT URINALYSIS DIPSTICK
BILIRUBIN UA: NEGATIVE
Blood, UA: NEGATIVE
GLUCOSE UA: 250
Ketones, UA: NEGATIVE
Leukocytes, UA: NEGATIVE
NITRITE UA: NEGATIVE
PH UA: 6
PROTEIN UA: NEGATIVE
SPEC GRAV UA: 1.015
Urobilinogen, UA: NEGATIVE

## 2014-05-15 ENCOUNTER — Telehealth: Payer: Self-pay

## 2014-05-15 ENCOUNTER — Encounter: Payer: Self-pay | Admitting: Obstetrics

## 2014-05-15 LAB — STREP B DNA PROBE: GBSP: DETECTED

## 2014-05-15 NOTE — Progress Notes (Signed)
Subjective:    Tina [name] is a 24 y.o. female being seen today for her obstetrical visit. She is at 8130w3d gestation. Patient reports no complaints. Fetal movement: normal.  Problem List Items Addressed This Visit    Supervision of normal first pregnancy - Primary   Relevant Orders   POCT urinalysis dipstick (Completed)   Strep B DNA probe     Patient Active Problem List   Diagnosis Date Noted  . ASCUS favor benign 12/02/2013  . Supervision of normal first pregnancy 11/29/2013   Objective:    BP 136/78 mmHg  Pulse 120  Temp(Src) 98.3 F (36.8 C)  Wt 202 lb (91.627 kg)  LMP 09/09/2013 FHT:  160 BPM  Uterine Size: size equals dates  Presentation: unsure     Assessment:    Pregnancy @ 5530w3d weeks   Plan:     labs reviewed, problem list updated Consent signed. GBS sent TDAP offered  Rhogam given for RH negative Pediatrician: discussed. Infant feeding: plans to breastfeed. Maternity leave: discussed. Cigarette smoking: never smoked. Orders Placed This Encounter  Procedures  . Strep B DNA probe  . POCT urinalysis dipstick   No orders of the defined types were placed in this encounter.   Follow up in 1 Week.

## 2014-05-15 NOTE — Telephone Encounter (Signed)
fmla papers ready to be picked up - paid 15.00 on 05/14/14

## 2014-05-18 ENCOUNTER — Inpatient Hospital Stay (HOSPITAL_COMMUNITY)
Admission: EM | Admit: 2014-05-18 | Discharge: 2014-05-18 | Disposition: A | Discharge status: Home / Self Care | Payer: No Typology Code available for payment source | Attending: Obstetrics | Admitting: Obstetrics

## 2014-05-18 ENCOUNTER — Encounter (HOSPITAL_COMMUNITY): Payer: Self-pay | Admitting: Nurse Practitioner

## 2014-05-18 DIAGNOSIS — S3992XD Unspecified injury of lower back, subsequent encounter: Secondary | ICD-10-CM

## 2014-05-18 DIAGNOSIS — N949 Unspecified condition associated with female genital organs and menstrual cycle: Secondary | ICD-10-CM | POA: Insufficient documentation

## 2014-05-18 DIAGNOSIS — Z3A35 35 weeks gestation of pregnancy: Secondary | ICD-10-CM | POA: Diagnosis not present

## 2014-05-18 DIAGNOSIS — Y92410 Unspecified street and highway as the place of occurrence of the external cause: Secondary | ICD-10-CM | POA: Insufficient documentation

## 2014-05-18 DIAGNOSIS — O9989 Other specified diseases and conditions complicating pregnancy, childbirth and the puerperium: Secondary | ICD-10-CM | POA: Insufficient documentation

## 2014-05-18 DIAGNOSIS — O9A213 Injury, poisoning and certain other consequences of external causes complicating pregnancy, third trimester: Secondary | ICD-10-CM

## 2014-05-18 DIAGNOSIS — Z3490 Encounter for supervision of normal pregnancy, unspecified, unspecified trimester: Secondary | ICD-10-CM

## 2014-05-18 MED ORDER — CYCLOBENZAPRINE HCL 10 MG PO TABS: 10.0000 mg | ORAL_TABLET | Freq: Two times a day (BID) | ORAL | Status: DC | PRN

## 2014-05-18 MED ORDER — ACETAMINOPHEN 325 MG PO TABS
650.0000 mg | ORAL_TABLET | Freq: Once | ORAL | Status: AC
  Administered 2014-05-18: 650 mg via ORAL
  Filled 2014-05-18: qty 2

## 2014-05-18 MED ORDER — CYCLOBENZAPRINE HCL 10 MG PO TABS
10.0000 mg | ORAL_TABLET | Freq: Once | ORAL | Status: AC
  Administered 2014-05-18: 10 mg via ORAL
  Filled 2014-05-18: qty 1

## 2014-05-18 NOTE — ED Notes (Signed)
Rapid OB at bedside 

## 2014-05-18 NOTE — ED Notes (Signed)
Patient placed on fetal heart monitor with rates 143-168 with variation. Patient alert and oriented updated on plan for rapid OB to see patient. Pt. Boyfriend at bedside.
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
## 2014-05-18 NOTE — Discharge Instructions (Signed)
Third Trimester of Pregnancy °The third trimester is from week 29 through week 42, months 7 through 9. The third trimester is a time when the fetus is growing rapidly. At the end of the ninth month, the fetus is about 20 inches in length and weighs 6-10 pounds.  °BODY CHANGES °Your body goes through many changes during pregnancy. The changes vary from woman to woman.  °· Your weight will continue to increase. You can expect to gain 25-35 pounds (11-16 kg) by the end of the pregnancy. °· You may begin to get stretch marks on your hips, abdomen, and breasts. °· You may urinate more often because the fetus is moving lower into your pelvis and pressing on your bladder. °· You may develop or continue to have heartburn as a result of your pregnancy. °· You may develop constipation because certain hormones are causing the muscles that push waste through your intestines to slow down. °· You may develop hemorrhoids or swollen, bulging veins (varicose veins). °· You may have pelvic pain because of the weight gain and pregnancy hormones relaxing your joints between the bones in your pelvis. Backaches may result from overexertion of the muscles supporting your posture. °· You may have changes in your hair. These can include thickening of your hair, rapid growth, and changes in texture. Some women also have hair loss during or after pregnancy, or hair that feels dry or thin. Your hair will most likely return to normal after your baby is born. °· Your breasts will continue to grow and be tender. A yellow discharge may leak from your breasts called colostrum. °· Your belly button may stick out. °· You may feel short of breath because of your expanding uterus. °· You may notice the fetus "dropping," or moving lower in your abdomen. °· You may have a bloody mucus discharge. This usually occurs a few days to a week before labor begins. °· Your cervix becomes thin and soft (effaced) near your due date. °WHAT TO EXPECT AT YOUR PRENATAL  EXAMS  °You will have prenatal exams every 2 weeks until week 36. Then, you will have weekly prenatal exams. During a routine prenatal visit: °· You will be weighed to make sure you and the fetus are growing normally. °· Your blood pressure is taken. °· Your abdomen will be measured to track your baby's growth. °· The fetal heartbeat will be listened to. °· Any test results from the previous visit will be discussed. °· You may have a cervical check near your due date to see if you have effaced. °At around 36 weeks, your caregiver will check your cervix. At the same time, your caregiver will also perform a test on the secretions of the vaginal tissue. This test is to determine if a type of bacteria, Group B streptococcus, is present. Your caregiver will explain this further. °Your caregiver may ask you: °· What your birth plan is. °· How you are feeling. °· If you are feeling the baby move. °· If you have had any abnormal symptoms, such as leaking fluid, bleeding, severe headaches, or abdominal cramping. °· If you have any questions. °Other tests or screenings that may be performed during your third trimester include: °· Blood tests that check for low iron levels (anemia). °· Fetal testing to check the health, activity level, and growth of the fetus. Testing is done if you have certain medical conditions or if there are problems during the pregnancy. °FALSE LABOR °You may feel small, irregular contractions that   eventually go away. These are called Braxton Hicks contractions, or false labor. Contractions may last for hours, days, or even weeks before true labor sets in. If contractions come at regular intervals, intensify, or become painful, it is best to be seen by your caregiver.  °SIGNS OF LABOR  °· Menstrual-like cramps. °· Contractions that are 5 minutes apart or less. °· Contractions that start on the top of the uterus and spread down to the lower abdomen and back. °· A sense of increased pelvic pressure or back  pain. °· A watery or bloody mucus discharge that comes from the vagina. °If you have any of these signs before the 37th week of pregnancy, call your caregiver right away. You need to go to the hospital to get checked immediately. °HOME CARE INSTRUCTIONS  °· Avoid all smoking, herbs, alcohol, and unprescribed drugs. These chemicals affect the formation and growth of the baby. °· Follow your caregiver's instructions regarding medicine use. There are medicines that are either safe or unsafe to take during pregnancy. °· Exercise only as directed by your caregiver. Experiencing uterine cramps is a good sign to stop exercising. °· Continue to eat regular, healthy meals. °· Wear a good support bra for breast tenderness. °· Do not use hot tubs, steam rooms, or saunas. °· Wear your seat belt at all times when driving. °· Avoid raw meat, uncooked cheese, cat litter boxes, and soil used by cats. These carry germs that can cause birth defects in the baby. °· Take your prenatal vitamins. °· Try taking a stool softener (if your caregiver approves) if you develop constipation. Eat more high-fiber foods, such as fresh vegetables or fruit and whole grains. Drink plenty of fluids to keep your urine clear or pale yellow. °· Take warm sitz baths to soothe any pain or discomfort caused by hemorrhoids. Use hemorrhoid cream if your caregiver approves. °· If you develop varicose veins, wear support hose. Elevate your feet for 15 minutes, 3-4 times a day. Limit salt in your diet. °· Avoid heavy lifting, wear low heal shoes, and practice good posture. °· Rest a lot with your legs elevated if you have leg cramps or low back pain. °· Visit your dentist if you have not gone during your pregnancy. Use a soft toothbrush to brush your teeth and be gentle when you floss. °· A sexual relationship may be continued unless your caregiver directs you otherwise. °· Do not travel far distances unless it is absolutely necessary and only with the approval  of your caregiver. °· Take prenatal classes to understand, practice, and ask questions about the labor and delivery. °· Make a trial run to the hospital. °· Pack your hospital bag. °· Prepare the baby's nursery. °· Continue to go to all your prenatal visits as directed by your caregiver. °SEEK MEDICAL CARE IF: °· You are unsure if you are in labor or if your water has broken. °· You have dizziness. °· You have mild pelvic cramps, pelvic pressure, or nagging pain in your abdominal area. °· You have persistent nausea, vomiting, or diarrhea. °· You have a bad smelling vaginal discharge. °· You have pain with urination. °SEEK IMMEDIATE MEDICAL CARE IF:  °· You have a fever. °· You are leaking fluid from your vagina. °· You have spotting or bleeding from your vagina. °· You have severe abdominal cramping or pain. °· You have rapid weight loss or gain. °· You have shortness of breath with chest pain. °· You notice sudden or extreme swelling   of your face, hands, ankles, feet, or legs.  You have not felt your baby move in over an hour.  You have severe headaches that do not go away with medicine.  You have vision changes. Document Released: 03/10/2001 Document Revised: 03/21/2013 Document Reviewed: 05/17/2012 [facility] Patient Information 2015 [place], [place]. This information is not intended to replace advice given to you by your health care provider. Make sure you discuss any questions you have with your health care provider. Motor Vehicle Collision It is common to have multiple bruises and sore muscles after a motor vehicle collision (MVC). These tend to feel worse for the first 24 hours. You may have the most stiffness and soreness over the first several hours. You may also feel worse when you wake up the first morning after your collision. After this point, you will usually begin to improve with each day. The speed of improvement often depends on the severity of the collision, the number of injuries, and the  location and nature of these injuries. HOME CARE INSTRUCTIONS  Put ice on the injured area.  Put ice in a plastic bag.  Place a towel between your skin and the bag.  Leave the ice on for 15-20 minutes, 3-4 times a day, or as directed by your health care provider.  Drink enough fluids to keep your urine clear or pale yellow. Do not drink alcohol.  Take a warm shower or bath once or twice a day. This will increase blood flow to sore muscles.  You may return to activities as directed by your caregiver. Be careful when lifting, as this may aggravate neck or back pain.  Only take over-the-counter or prescription medicines for pain, discomfort, or fever as directed by your caregiver. Do not use aspirin. This may increase bruising and bleeding. SEEK IMMEDIATE MEDICAL CARE IF:  You have numbness, tingling, or weakness in the arms or legs.  You develop severe headaches not relieved with medicine.  You have severe neck pain, especially tenderness in the middle of the back of your neck.  You have changes in bowel or bladder control.  There is increasing pain in any area of the body.  You have shortness of breath, light-headedness, dizziness, or fainting.  You have chest pain.  You feel sick to your stomach (nauseous), throw up (vomit), or sweat.  You have increasing abdominal discomfort.  There is blood in your urine, stool, or vomit.  You have pain in your shoulder (shoulder strap areas).  You feel your symptoms are getting worse. MAKE SURE YOU:  Understand these instructions.  Will watch your condition.  Will get help right away if you are not doing well or get worse. Document Released: 03/16/2005 Document Revised: 07/31/2013 Document Reviewed: 08/13/2010 [facility] Patient Information 2015 [place], [place]. This information is not intended to replace advice given to you by your health care provider. Make sure you discuss any questions you have with your health care  provider.

## 2014-05-18 NOTE — ED Notes (Signed)
OB Rapid Response contacted

## 2014-05-23 ENCOUNTER — Encounter: Admitting: Obstetrics

## 2014-05-23 ENCOUNTER — Ambulatory Visit (INDEPENDENT_AMBULATORY_CARE_PROVIDER_SITE_OTHER): Admitting: Obstetrics

## 2014-05-23 VITALS — BP 113/76 | HR 106 | Temp 98.7°F | Wt 201.0 lb

## 2014-05-23 DIAGNOSIS — Z3403 Encounter for supervision of normal first pregnancy, third trimester: Secondary | ICD-10-CM

## 2014-05-23 LAB — POCT URINALYSIS DIPSTICK
Bilirubin, UA: NEGATIVE
Blood, UA: NEGATIVE
GLUCOSE UA: NEGATIVE
Ketones, UA: NEGATIVE
Leukocytes, UA: NEGATIVE
Nitrite, UA: NEGATIVE
PROTEIN UA: NEGATIVE
SPEC GRAV UA: 1.015
UROBILINOGEN UA: NEGATIVE
pH, UA: 6

## 2014-05-24 ENCOUNTER — Encounter: Payer: Self-pay | Admitting: Obstetrics

## 2014-05-24 NOTE — Progress Notes (Signed)
Subjective:    Talbert ForestCharlise Forse is a 24 y.o. female being seen today for her obstetrical visit. She is at 272w5d gestation. Patient reports no complaints. Fetal movement: normal.  Problem List Items Addressed This Visit    Supervision of normal first pregnancy - Primary   Relevant Orders   POCT urinalysis dipstick (Completed)     Patient Active Problem List   Diagnosis Date Noted  . ASCUS favor benign 12/02/2013  . Supervision of normal first pregnancy 11/29/2013   Objective:    BP 113/76 mmHg  Pulse 106  Temp(Src) 98.7 F (37.1 C)  Wt 201 lb (91.173 kg)  LMP 09/09/2013 FHT:  160 BPM  Uterine Size: size equals dates  Presentation: unsure     Assessment:    Pregnancy @ 362w5d weeks   Plan:     labs reviewed, problem list updated Consent signed. GBS sent TDAP offered  Rhogam given for RH negative Pediatrician: discussed. Infant feeding: plans to breastfeed. Maternity leave: discussed. Cigarette smoking: never smoked. Orders Placed This Encounter  Procedures  . POCT urinalysis dipstick   No orders of the defined types were placed in this encounter.   Follow up in 1 Week.

## 2014-05-30 ENCOUNTER — Ambulatory Visit (INDEPENDENT_AMBULATORY_CARE_PROVIDER_SITE_OTHER): Admitting: Obstetrics

## 2014-05-30 VITALS — BP 116/72 | HR 109 | Temp 98.3°F | Wt 201.0 lb

## 2014-05-30 DIAGNOSIS — Z3403 Encounter for supervision of normal first pregnancy, third trimester: Secondary | ICD-10-CM

## 2014-05-30 LAB — POCT URINALYSIS DIPSTICK
Bilirubin, UA: NEGATIVE
Blood, UA: NEGATIVE
Glucose, UA: NEGATIVE
KETONES UA: NEGATIVE
Leukocytes, UA: NEGATIVE
NITRITE UA: NEGATIVE
PH UA: 6
Protein, UA: NEGATIVE
Spec Grav, UA: 1.015
UROBILINOGEN UA: NEGATIVE

## 2014-05-30 NOTE — Progress Notes (Signed)
Subjective:    Tina [name] is a 24 y.o. female being seen today for her obstetrical visit. She is at 5648w4d gestation. Patient reports no complaints. Fetal movement: normal.  Problem List Items Addressed This Visit    Supervision of normal first pregnancy - Primary   Relevant Orders   POCT urinalysis dipstick (Completed)     Patient Active Problem List   Diagnosis Date Noted  . ASCUS favor benign 12/02/2013  . Supervision of normal first pregnancy 11/29/2013    Objective:    BP 116/72 mmHg  Pulse 109  Temp(Src) 98.3 F (36.8 C)  Wt 201 lb (91.173 kg)  LMP 09/09/2013 FHT: 160 BPM  Uterine Size: size equals dates  Presentations: unsure  Pelvic Exam: Deferred    Assessment:    Pregnancy @ 5648w4d weeks   Plan:   Plans for delivery: Vaginal anticipated; labs reviewed; problem list updated Counseling: Consent signed. Infant feeding: plans to breastfeed. Cigarette smoking: never smoked. L&D discussion: symptoms of labor, discussed when to call, discussed what number to call, anesthetic/analgesic options reviewed and delivering clinician:  plans Physician. Postpartum supports and preparation: circumcision discussed and contraception plans discussed.  Follow up in 1 Week.

## 2014-06-05 ENCOUNTER — Ambulatory Visit (INDEPENDENT_AMBULATORY_CARE_PROVIDER_SITE_OTHER): Admitting: Obstetrics

## 2014-06-05 ENCOUNTER — Encounter: Payer: Self-pay | Admitting: Obstetrics

## 2014-06-05 VITALS — BP 132/75 | HR 115 | Wt 201.0 lb

## 2014-06-05 DIAGNOSIS — Z3403 Encounter for supervision of normal first pregnancy, third trimester: Secondary | ICD-10-CM

## 2014-06-05 LAB — POCT URINALYSIS DIPSTICK
BILIRUBIN UA: NEGATIVE
Blood, UA: NEGATIVE
GLUCOSE UA: NEGATIVE
Ketones, UA: NEGATIVE
LEUKOCYTES UA: NEGATIVE
Nitrite, UA: NEGATIVE
Protein, UA: NEGATIVE
Spec Grav, UA: 1.01
Urobilinogen, UA: NEGATIVE
pH, UA: 6

## 2014-06-05 NOTE — Progress Notes (Signed)
Subjective:    Tina [name] is a 24 y.o. female being seen today for her obstetrical visit. She is at 4557w3d gestation. Patient reports no complaints. Fetal movement: normal.  Problem List Items Addressed This Visit    Supervision of normal first pregnancy - Primary   Relevant Orders   POCT urinalysis dipstick (Completed)     Patient Active Problem List   Diagnosis Date Noted  . ASCUS favor benign 12/02/2013  . Supervision of normal first pregnancy 11/29/2013    Objective:    BP 132/75 mmHg  Pulse 115  Wt 201 lb (91.173 kg)  LMP 09/09/2013 FHT: 160 BPM  Uterine Size: size equals dates  Presentations: unsure  Pelvic Exam: Deferred    Assessment:    Pregnancy @ 7557w3d weeks   Plan:   Plans for delivery: Vaginal anticipated; labs reviewed; problem list updated Counseling: Consent signed. Infant feeding: plans to breastfeed. Cigarette smoking: never smoked. L&D discussion: symptoms of labor, discussed when to call, discussed what number to call, anesthetic/analgesic options reviewed and delivering clinician:  plans Physician. Postpartum supports and preparation: circumcision discussed and contraception plans discussed.  Follow up in 1 Week.

## 2014-06-12 ENCOUNTER — Encounter: Admitting: Obstetrics

## 2014-06-14 ENCOUNTER — Encounter: Payer: Self-pay | Admitting: Obstetrics

## 2014-06-14 ENCOUNTER — Ambulatory Visit (INDEPENDENT_AMBULATORY_CARE_PROVIDER_SITE_OTHER): Admitting: Obstetrics

## 2014-06-14 VITALS — BP 124/75 | HR 111 | Temp 98.9°F | Wt 199.0 lb

## 2014-06-14 DIAGNOSIS — Z3403 Encounter for supervision of normal first pregnancy, third trimester: Secondary | ICD-10-CM | POA: Diagnosis not present

## 2014-06-14 LAB — POCT URINALYSIS DIPSTICK
Bilirubin, UA: NEGATIVE
Blood, UA: NEGATIVE
GLUCOSE UA: NEGATIVE
Ketones, UA: NEGATIVE
Leukocytes, UA: NEGATIVE
Nitrite, UA: NEGATIVE
PROTEIN UA: NEGATIVE
SPEC GRAV UA: 1.015
Urobilinogen, UA: NEGATIVE
pH, UA: 6.5

## 2014-06-14 NOTE — Progress Notes (Signed)
Subjective:    Tina ForestCharlise Mcphail is a 24 y.o. female being seen today for her obstetrical visit. She is at 5265w5d gestation. Patient reports no complaints. Fetal movement: normal.  Problem List Items Addressed This Visit    Supervision of normal first pregnancy - Primary   Relevant Orders   POCT urinalysis dipstick (Completed)     Patient Active Problem List   Diagnosis Date Noted  . ASCUS favor benign 12/02/2013  . Supervision of normal first pregnancy 11/29/2013    Objective:    BP 124/75 mmHg  Pulse 111  Temp(Src) 98.9 F (37.2 C)  Wt 199 lb (90.266 kg)  LMP 09/09/2013 FHT: 160 BPM  Uterine Size: size equals dates  Presentations: cephalic  Pelvic Exam:              Dilation: 1cm       Effacement: 50%             Station:  -3    Consistency: soft            Position: posterior     Assessment:    Pregnancy @ 5165w5d weeks   Plan:   Plans for delivery: Vaginal anticipated; labs reviewed; problem list updated Counseling: Consent signed. Infant feeding: plans to breastfeed. Cigarette smoking: never smoked. L&D discussion: symptoms of labor, discussed when to call, discussed what number to call, anesthetic/analgesic options reviewed and delivering clinician:  plans Physician. Postpartum supports and preparation: circumcision discussed and contraception plans discussed.  Follow up in 1 Week.

## 2014-06-15 ENCOUNTER — Encounter: Admitting: Obstetrics

## 2014-06-21 ENCOUNTER — Encounter: Payer: Self-pay | Admitting: Obstetrics

## 2014-06-21 ENCOUNTER — Ambulatory Visit (INDEPENDENT_AMBULATORY_CARE_PROVIDER_SITE_OTHER): Admitting: Obstetrics

## 2014-06-21 ENCOUNTER — Telehealth (HOSPITAL_COMMUNITY): Payer: Self-pay | Admitting: *Deleted

## 2014-06-21 ENCOUNTER — Encounter (HOSPITAL_COMMUNITY): Payer: Self-pay | Admitting: *Deleted

## 2014-06-21 VITALS — BP 115/76 | HR 106 | Temp 98.5°F | Wt 199.0 lb

## 2014-06-21 DIAGNOSIS — Z3483 Encounter for supervision of other normal pregnancy, third trimester: Secondary | ICD-10-CM | POA: Diagnosis not present

## 2014-06-21 LAB — POCT URINALYSIS DIPSTICK
Bilirubin, UA: NEGATIVE
Blood, UA: NEGATIVE
GLUCOSE UA: NEGATIVE
KETONES UA: NEGATIVE
Leukocytes, UA: NEGATIVE
Nitrite, UA: NEGATIVE
Protein, UA: NEGATIVE
Urobilinogen, UA: NEGATIVE
pH, UA: 6

## 2014-06-21 NOTE — Progress Notes (Signed)
Subjective:    Tina ForestCharlise [name] is a 24 y.o. female being seen today for her obstetrical visit. She is at 2985w5d gestation. Patient reports no complaints. Fetal movement: normal.  Problem List Items Addressed This Visit    None    Visit Diagnoses    Encounter for supervision of other normal pregnancy in third trimester    -  Primary    Relevant Orders    POCT urinalysis dipstick (Completed)      Patient Active Problem List   Diagnosis Date Noted  . ASCUS favor benign 12/02/2013  . Supervision of normal first pregnancy 11/29/2013    Objective:    BP 115/76 mmHg  Pulse 106  Temp(Src) 98.5 F (36.9 C)  Wt 199 lb (90.266 kg)  LMP 09/09/2013 FHT:  150 BPM  Uterine Size: size equals dates  Presentation: cephalic  Pelvic Exam: Deferred    Assessment:    Pregnancy @ 4985w5d  weeks   Plan:     IOL 06-22-14  Postdates management: discussed fetal surveillance and induction, discussed fetal movement, NST reactive. Induction: scheduled for 06-22-14, written information given.

## 2014-06-21 NOTE — Telephone Encounter (Signed)
Preadmission screen  

## 2014-06-22 ENCOUNTER — Encounter (HOSPITAL_COMMUNITY): Payer: Self-pay

## 2014-06-22 ENCOUNTER — Inpatient Hospital Stay (HOSPITAL_COMMUNITY)
Admission: RE | Admit: 2014-06-22 | Discharge: 2014-06-27 | DRG: 775 | Disposition: A | Discharge status: Home / Self Care | Source: Ambulatory Visit | Attending: Obstetrics | Admitting: Obstetrics

## 2014-06-22 VITALS — BP 116/76 | HR 69 | Temp 98.1°F | Resp 18 | Ht 63.0 in | Wt 199.0 lb

## 2014-06-22 DIAGNOSIS — O480 Post-term pregnancy: Secondary | ICD-10-CM | POA: Diagnosis present

## 2014-06-22 DIAGNOSIS — Z833 Family history of diabetes mellitus: Secondary | ICD-10-CM

## 2014-06-22 DIAGNOSIS — Z8249 Family history of ischemic heart disease and other diseases of the circulatory system: Secondary | ICD-10-CM | POA: Diagnosis not present

## 2014-06-22 DIAGNOSIS — O99824 Streptococcus B carrier state complicating childbirth: Secondary | ICD-10-CM | POA: Diagnosis present

## 2014-06-22 DIAGNOSIS — Z3A41 41 weeks gestation of pregnancy: Secondary | ICD-10-CM | POA: Diagnosis present

## 2014-06-22 DIAGNOSIS — IMO0002 Reserved for concepts with insufficient information to code with codable children: Secondary | ICD-10-CM

## 2014-06-22 DIAGNOSIS — Z8759 Personal history of other complications of pregnancy, childbirth and the puerperium: Secondary | ICD-10-CM

## 2014-06-22 DIAGNOSIS — Z3401 Encounter for supervision of normal first pregnancy, first trimester: Secondary | ICD-10-CM

## 2014-06-22 LAB — TYPE AND SCREEN
ABO/RH(D): O POS
Antibody Screen: NEGATIVE

## 2014-06-22 LAB — CBC
HCT: 30.3 % — ABNORMAL LOW (ref 36.0–46.0)
HEMOGLOBIN: 9.9 g/dL — AB (ref 12.0–15.0)
MCH: 25.5 pg — ABNORMAL LOW (ref 26.0–34.0)
MCHC: 32.7 g/dL (ref 30.0–36.0)
MCV: 78.1 fL (ref 78.0–100.0)
PLATELETS: 244 10*3/uL (ref 150–400)
RBC: 3.88 MIL/uL (ref 3.87–5.11)
RDW: 15 % (ref 11.5–15.5)
WBC: 13 10*3/uL — ABNORMAL HIGH (ref 4.0–10.5)

## 2014-06-22 LAB — ABO/RH: ABO/RH(D): O POS

## 2014-06-22 LAB — HIV ANTIBODY (ROUTINE TESTING W REFLEX): HIV Screen 4th Generation wRfx: NONREACTIVE

## 2014-06-22 LAB — SYPHILIS: RPR W/REFLEX TO RPR TITER AND TREPONEMAL ANTIBODIES, TRADITIONAL SCREENING AND DIAGNOSIS ALGORITHM: RPR Ser Ql: NONREACTIVE

## 2014-06-22 MED ORDER — LACTATED RINGERS IV SOLN
INTRAVENOUS | Status: DC
  Administered 2014-06-22 – 2014-06-24 (×4): via INTRAVENOUS

## 2014-06-22 MED ORDER — OXYCODONE-ACETAMINOPHEN 5-325 MG PO TABS: 2.0000 | ORAL_TABLET | ORAL | Status: DC | PRN

## 2014-06-22 MED ORDER — OXYCODONE-ACETAMINOPHEN 5-325 MG PO TABS: 1.0000 | ORAL_TABLET | ORAL | Status: DC | PRN

## 2014-06-22 MED ORDER — LACTATED RINGERS IV SOLN: 500.0000 mL | INTRAVENOUS | Status: DC | PRN

## 2014-06-22 MED ORDER — PENICILLIN G POTASSIUM 5000000 UNITS IJ SOLR
5.0000 10*6.[IU] | Freq: Once | INTRAVENOUS | Status: AC
  Administered 2014-06-22: 5 10*6.[IU] via INTRAVENOUS
  Filled 2014-06-22: qty 5

## 2014-06-22 MED ORDER — NALBUPHINE HCL 10 MG/ML IJ SOLN
10.0000 mg | Freq: Four times a day (QID) | INTRAMUSCULAR | Status: DC | PRN
  Administered 2014-06-23: 10 mg via INTRAMUSCULAR
  Filled 2014-06-22: qty 1

## 2014-06-22 MED ORDER — PENICILLIN G POTASSIUM 5000000 UNITS IJ SOLR
2.5000 10*6.[IU] | INTRAVENOUS | Status: DC
  Administered 2014-06-23 – 2014-06-24 (×9): 2.5 10*6.[IU] via INTRAVENOUS
  Filled 2014-06-22 (×20): qty 2.5

## 2014-06-22 MED ORDER — FENTANYL 2.5 MCG/ML BUPIVACAINE 1/10 % EPIDURAL INFUSION (WH - ANES): 14.0000 mL/h | INTRAMUSCULAR | Status: DC | PRN

## 2014-06-22 MED ORDER — PROMETHAZINE HCL 25 MG/ML IJ SOLN
25.0000 mg | Freq: Four times a day (QID) | INTRAMUSCULAR | Status: DC | PRN
  Administered 2014-06-23: 25 mg via INTRAMUSCULAR
  Filled 2014-06-22: qty 1

## 2014-06-22 MED ORDER — PHENYLEPHRINE 40 MCG/ML (10ML) SYRINGE FOR IV PUSH (FOR BLOOD PRESSURE SUPPORT): 80.0000 ug | PREFILLED_SYRINGE | INTRAVENOUS | Status: DC | PRN

## 2014-06-22 MED ORDER — MISOPROSTOL 25 MCG QUARTER TABLET
25.0000 ug | ORAL_TABLET | ORAL | Status: DC | PRN
  Administered 2014-06-22 – 2014-06-23 (×5): 25 ug via VAGINAL
  Filled 2014-06-22 (×5): qty 0.25

## 2014-06-22 MED ORDER — DIPHENHYDRAMINE HCL 50 MG/ML IJ SOLN: 12.5000 mg | INTRAMUSCULAR | Status: DC | PRN

## 2014-06-22 MED ORDER — LIDOCAINE HCL (PF) 1 % IJ SOLN
30.0000 mL | INTRAMUSCULAR | Status: DC | PRN
  Filled 2014-06-22: qty 30

## 2014-06-22 MED ORDER — ACETAMINOPHEN 325 MG PO TABS: 650.0000 mg | ORAL_TABLET | ORAL | Status: DC | PRN

## 2014-06-22 MED ORDER — OXYTOCIN BOLUS FROM INFUSION
500.0000 mL | INTRAVENOUS | Status: DC
  Administered 2014-06-25: 500 mL via INTRAVENOUS

## 2014-06-22 MED ORDER — NALBUPHINE HCL 10 MG/ML IJ SOLN
10.0000 mg | INTRAMUSCULAR | Status: DC | PRN
  Administered 2014-06-23 – 2014-06-24 (×2): 10 mg via INTRAVENOUS
  Filled 2014-06-22 (×2): qty 1

## 2014-06-22 MED ORDER — OXYTOCIN 40 UNITS IN LACTATED RINGERS INFUSION - SIMPLE MED: 62.5000 mL/h | INTRAVENOUS | Status: DC

## 2014-06-22 MED ORDER — EPHEDRINE 5 MG/ML INJ
10.0000 mg | INTRAVENOUS | Status: DC | PRN
Start: 2014-06-22 — End: 2014-06-22

## 2014-06-22 MED ORDER — LACTATED RINGERS IV SOLN
500.0000 mL | Freq: Once | INTRAVENOUS | Status: DC
Start: 2014-06-22 — End: 2014-06-22

## 2014-06-22 MED ORDER — TERBUTALINE SULFATE 1 MG/ML IJ SOLN: 0.2500 mg | Freq: Once | INTRAMUSCULAR | Status: AC | PRN

## 2014-06-22 MED ORDER — ONDANSETRON HCL 4 MG/2ML IJ SOLN: 4.0000 mg | Freq: Four times a day (QID) | INTRAMUSCULAR | Status: DC | PRN

## 2014-06-22 MED ORDER — CITRIC ACID-SODIUM CITRATE 334-500 MG/5ML PO SOLN: 30.0000 mL | ORAL | Status: DC | PRN

## 2014-06-22 MED ORDER — EPHEDRINE 5 MG/ML INJ: 10.0000 mg | INTRAVENOUS | Status: DC | PRN

## 2014-06-22 NOTE — H&P (Signed)
Tina ForestCharlise [name] is a 24 y.o. female presenting for IOL for postdates. Maternal Medical History:  Fetal activity: Perceived fetal activity is normal.   Last perceived fetal movement was within the past hour.    Prenatal complications: no prenatal complications Prenatal Complications - Diabetes: none.    OB History    Gravida Para Term Preterm AB TAB SAB Ectopic Multiple Living   2 0 0 0 1 0 0 0 0 0      Past Medical History  Diagnosis Date  . Medical history non-contributory    Past Surgical History  Procedure Laterality Date  . Nasal cyst removal    . Right arm surgery      Rod and screws placed   Family History: family history includes Cancer in her mother; Diabetes in her father; Hypertension in her father. Social History:  reports that she has never smoked. She has never used smokeless tobacco. She reports that she does not drink alcohol or use illicit drugs.   Prenatal Transfer Tool  Maternal Diabetes: No Genetic Screening: Normal Maternal Ultrasounds/Referrals: Normal Fetal Ultrasounds or other Referrals:  None Maternal Substance Abuse:  No Significant Maternal Medications:  None Significant Maternal Lab Results:  Lab values include: Group B Strep positive Other Comments:  None  Review of Systems  All other systems reviewed and are negative.     Last menstrual period 09/09/2013. Maternal Exam:  Abdomen: Patient reports no abdominal tenderness. Fetal presentation: vertex  Introitus: Normal vulva. Normal vagina.  Cervix: Cervix evaluated by digital exam.     Physical Exam  Nursing note and vitals reviewed. Constitutional: She is oriented to person, place, and time. She appears well-developed and well-nourished.  HENT:  Head: Normocephalic and atraumatic.  Eyes: Conjunctivae are normal. Pupils are equal, round, and reactive to light.  Neck: Normal range of motion. Neck supple.  Cardiovascular: Normal rate and regular rhythm.   Respiratory: Effort normal and  breath sounds normal.  GI: Soft. Bowel sounds are normal.  Genitourinary: Vagina normal and uterus normal.  Musculoskeletal: Normal range of motion.  Neurological: She is alert and oriented to person, place, and time.  Skin: Skin is warm and dry.  Psychiatric: She has a normal mood and affect. Her behavior is normal. Judgment and thought content normal.    Prenatal labs: ABO, Rh: O/POS/-- (09/02 1427) Antibody: NEG (09/02 1427) Rubella: 6.07 (09/02 1427) RPR: NON REAC (12/04 1515)  HBsAg: NEGATIVE (09/02 1427)  HIV: NONREACTIVE (12/04 1515)  GBS: Detected (02/15 1313)   Assessment/Plan: Postdates.  2 stage IOL.   HARPER,CHARLES A 06/22/2014, 7:09 AM

## 2014-06-22 NOTE — Progress Notes (Signed)
Pt eating supper, will place next cytotec dose when pt is finished eating.

## 2014-06-23 MED ORDER — OXYTOCIN 40 UNITS IN LACTATED RINGERS INFUSION - SIMPLE MED
1.0000 m[IU]/min | INTRAVENOUS | Status: DC
  Administered 2014-06-23: 1 m[IU]/min via INTRAVENOUS
  Filled 2014-06-23: qty 1000

## 2014-06-23 MED ORDER — MISOPROSTOL 200 MCG PO TABS: 50.0000 ug | ORAL_TABLET | Freq: Four times a day (QID) | ORAL | Status: DC

## 2014-06-23 MED ORDER — TERBUTALINE SULFATE 1 MG/ML IJ SOLN: 0.2500 mg | Freq: Once | INTRAMUSCULAR | Status: AC | PRN

## 2014-06-23 MED ORDER — ZOLPIDEM TARTRATE 5 MG PO TABS
5.0000 mg | ORAL_TABLET | Freq: Every evening | ORAL | Status: DC | PRN
  Administered 2014-06-23 (×2): 5 mg via ORAL
  Filled 2014-06-23 (×2): qty 1

## 2014-06-23 NOTE — Progress Notes (Signed)
Provider notified of uc pattern, cervix and holding of cytotec.  Orders to repeat cytotec when uc's spread apart per protocol.

## 2014-06-23 NOTE — Progress Notes (Signed)
Pt sitting up eating meal and than showering and off monitors per Dr. Thomes LollingHarpers orders.

## 2014-06-23 NOTE — Progress Notes (Signed)
Tina ForestCharlise [name] is a 24 y.o. G2P0010 at 2636w0d by LMP admitted for induction of labor due to Post dates. Due date 06-16-14.  Subjective:   Objective: BP 108/58 mmHg  Pulse 84  Temp(Src) 98.4 F (36.9 C) (Oral)  Resp 18  Ht 5\' 3"  (1.6 m)  Wt 199 lb (90.266 kg)  BMI 35.26 kg/m2  LMP 09/09/2013      FHT:  FHR: 135 bpm, variability: moderate,  accelerations:  Present,  decelerations:  Absent UC:   irregular, every 2-6 minutes SVE:   Dilation: 1.5 Effacement (%): 50 Station: -2 Exam by:: B.Cagna,RN   Labs: Lab Results  Component Value Date   WBC 13.0* 06/22/2014   HGB 9.9* 06/22/2014   HCT 30.3* 06/22/2014   MCV 78.1 06/22/2014   PLT 244 06/22/2014    Assessment / Plan: 2 stage IOL due to postdates.  Latent phase.  Continue cervical ripening.  Labor: Latent phase Preeclampsia:  none Fetal Wellbeing:  Category I Pain Control:  Labor support without medications I/D:  n/a Anticipated MOD:  NSVD  Kieley Akter A 06/23/2014, 7:04 AM

## 2014-06-24 ENCOUNTER — Inpatient Hospital Stay (HOSPITAL_COMMUNITY): Admitting: Anesthesiology

## 2014-06-24 MED ORDER — FENTANYL 2.5 MCG/ML BUPIVACAINE 1/10 % EPIDURAL INFUSION (WH - ANES)
INTRAMUSCULAR | Status: DC | PRN
  Administered 2014-06-24: 13 mL/h via EPIDURAL

## 2014-06-24 MED ORDER — PHENYLEPHRINE 40 MCG/ML (10ML) SYRINGE FOR IV PUSH (FOR BLOOD PRESSURE SUPPORT)
80.0000 ug | PREFILLED_SYRINGE | INTRAVENOUS | Status: DC | PRN
  Filled 2014-06-24: qty 2

## 2014-06-24 MED ORDER — EPHEDRINE 5 MG/ML INJ
10.0000 mg | INTRAVENOUS | Status: DC | PRN
  Filled 2014-06-24: qty 2

## 2014-06-24 MED ORDER — PHENYLEPHRINE 40 MCG/ML (10ML) SYRINGE FOR IV PUSH (FOR BLOOD PRESSURE SUPPORT)
80.0000 ug | PREFILLED_SYRINGE | INTRAVENOUS | Status: DC | PRN
  Filled 2014-06-24: qty 2
  Filled 2014-06-24: qty 20

## 2014-06-24 MED ORDER — DIPHENHYDRAMINE HCL 50 MG/ML IJ SOLN: 12.5000 mg | INTRAMUSCULAR | Status: DC | PRN

## 2014-06-24 MED ORDER — LIDOCAINE HCL (PF) 1 % IJ SOLN
INTRAMUSCULAR | Status: DC | PRN
  Administered 2014-06-24 (×2): 4 mL

## 2014-06-24 MED ORDER — LACTATED RINGERS IV SOLN
500.0000 mL | Freq: Once | INTRAVENOUS | Status: AC
  Administered 2014-06-24: 500 mL via INTRAVENOUS

## 2014-06-24 MED ORDER — FENTANYL 2.5 MCG/ML BUPIVACAINE 1/10 % EPIDURAL INFUSION (WH - ANES)
14.0000 mL/h | INTRAMUSCULAR | Status: DC | PRN
  Administered 2014-06-24: 14 mL/h via EPIDURAL
  Filled 2014-06-24 (×2): qty 125

## 2014-06-24 NOTE — Anesthesia Preprocedure Evaluation (Signed)
Anesthesia Evaluation  Patient identified by MRN, date of birth, ID band Patient awake    Reviewed: Allergy & Precautions, Patient's Chart, lab work & pertinent test results  Airway Mallampati: II  TM Distance: >3 FB Neck ROM: Full    Dental no notable dental hx. (+) Teeth Intact   Pulmonary neg pulmonary ROS,  breath sounds clear to auscultation  Pulmonary exam normal       Cardiovascular negative cardio ROS  Rhythm:Regular Rate:Normal     Neuro/Psych negative neurological ROS  negative psych ROS   GI/Hepatic negative GI ROS, Neg liver ROS,   Endo/Other  Obesity  Renal/GU negative Renal ROS  negative genitourinary   Musculoskeletal negative musculoskeletal ROS (+)   Abdominal (+) + obese,   Peds  Hematology  (+) anemia ,   Anesthesia Other Findings   Reproductive/Obstetrics (+) Pregnancy                             Anesthesia Physical Anesthesia Plan  ASA: II  Anesthesia Plan: Epidural   Post-op Pain Management:    Induction:   Airway Management Planned: Natural Airway  Additional Equipment:   Intra-op Plan:   Post-operative Plan:   Informed Consent: I have reviewed the patients History and Physical, chart, labs and discussed the procedure including the risks, benefits and alternatives for the proposed anesthesia with the patient or authorized representative who has indicated his/her understanding and acceptance.     Plan Discussed with: Anesthesiologist  Anesthesia Plan Comments:         Anesthesia Quick Evaluation

## 2014-06-24 NOTE — Progress Notes (Signed)
Tina ForestCharlise [name] is a 24 y.o. G2P0010 at 4570w1d by LMP admitted for induction of labor due to Post dates. Due date 06-16-14.  Subjective:   Objective: BP 135/83 mmHg  Pulse 113  Temp(Src) 97.8 F (36.6 C) (Oral)  Resp 18  Ht 5\' 3"  (1.6 m)  Wt 199 lb (90.266 kg)  BMI 35.26 kg/m2  LMP 09/09/2013      FHT:  FHR: 150 bpm, variability: moderate,  accelerations:  Present,  decelerations:  Present variable UC:   regular, every 2-3 minutes SVE:   Dilation: 10 Effacement (%): 100 Station: 0 Exam by:: G.Whitfield, RN  Labs: Lab Results  Component Value Date   WBC 13.0* 06/22/2014   HGB 9.9* 06/22/2014   HCT 30.3* 06/22/2014   MCV 78.1 06/22/2014   PLT 244 06/22/2014    Assessment / Plan: Induction of labor due to postterm,  progressing well on pitocin  Labor: Progressing normally Preeclampsia:  n/a Fetal Wellbeing:  Category I Pain Control:  Epidural I/D:  n/a Anticipated MOD:  NSVD  HARPER,CHARLES A 06/24/2014, 10:23 PM

## 2014-06-24 NOTE — Anesthesia Procedure Notes (Signed)
Epidural Patient location during procedure: OB Start time: 06/24/2014 11:24 AM  Staffing Anesthesiologist: Mal AmabileFOSTER, Sy Saintjean Performed by: anesthesiologist   Preanesthetic Checklist Completed: patient identified, site marked, surgical consent, pre-op evaluation, timeout performed, IV checked, risks and benefits discussed and monitors and equipment checked  Epidural Patient position: sitting Prep: site prepped and draped and DuraPrep Patient monitoring: continuous pulse ox and blood pressure Approach: midline Location: L3-L4 Injection technique: LOR air  Needle:  Needle type: Tuohy  Needle gauge: 17 G Needle length: 9 cm and 9 Needle insertion depth: 5 cm cm Catheter type: closed end flexible Catheter size: 19 Gauge Catheter at skin depth: 10 cm Test dose: negative and Other  Assessment Events: blood not aspirated, injection not painful, no injection resistance, negative IV test and no paresthesia  Additional Notes Patient identified. Risks and benefits discussed including failed block, incomplete  Pain control, post dural puncture headache, nerve damage, paralysis, blood pressure Changes, nausea, vomiting, reactions to medications-both toxic and allergic and post Partum back pain. All questions were answered. Patient expressed understanding and wished to proceed. Sterile technique was used throughout procedure. Epidural site was Dressed with sterile barrier dressing. No paresthesias, signs of intravascular injection Or signs of intrathecal spread were encountered.  Patient was more comfortable after the epidural was dosed. Please see RN's note for documentation of vital signs and FHR which are stable.

## 2014-06-24 NOTE — Progress Notes (Signed)
[name] is a 24 y.o. G2P0010 at 4125w1d by LMP admitted for induction of labor due to Post dates. Due date 06-16-14 .  Subjective:   Objective: BP 120/74 mmHg  Pulse 88  Temp(Src) 97.9 F (36.6 C) (Oral)  Resp 20  Ht 5\' 3"  (1.6 m)  Wt 199 lb (90.266 kg)  BMI 35.26 kg/m2  LMP 09/09/2013      FHT:  FHR: 130 bpm, variability: moderate,  accelerations:  Present,  decelerations:  Absent UC:   regular, every 2-4  minutes SVE:   Dilation: 5 Effacement (%): 80 Station: -1 Exam by:: hk  Labs: Lab Results  Component Value Date   WBC 13.0* 06/22/2014   HGB 9.9* 06/22/2014   HCT 30.3* 06/22/2014   MCV 78.1 06/22/2014   PLT 244 06/22/2014    Assessment / Plan: Induction of labor due to postterm,  progressing well on pitocin  Labor: Progressing normally Preeclampsia:  n/a Fetal Wellbeing:  Category I Pain Control:  Nubain I/D:  n/a Anticipated MOD:  NSVD  HARPER,CHARLES A 06/24/2014, 3:20 PM

## 2014-06-25 ENCOUNTER — Encounter (HOSPITAL_COMMUNITY): Payer: Self-pay

## 2014-06-25 LAB — CBC
HCT: 30.1 % — ABNORMAL LOW (ref 36.0–46.0)
Hemoglobin: 9.8 g/dL — ABNORMAL LOW (ref 12.0–15.0)
MCH: 25.7 pg — ABNORMAL LOW (ref 26.0–34.0)
MCHC: 32.6 g/dL (ref 30.0–36.0)
MCV: 78.8 fL (ref 78.0–100.0)
Platelets: 217 10*3/uL (ref 150–400)
RBC: 3.82 MIL/uL — ABNORMAL LOW (ref 3.87–5.11)
RDW: 15.5 % (ref 11.5–15.5)
WBC: 18.9 10*3/uL — ABNORMAL HIGH (ref 4.0–10.5)

## 2014-06-25 MED ORDER — ACETAMINOPHEN 325 MG PO TABS: 650.0000 mg | ORAL_TABLET | ORAL | Status: DC | PRN

## 2014-06-25 MED ORDER — PRENATAL MULTIVITAMIN CH
1.0000 | ORAL_TABLET | Freq: Every day | ORAL | Status: DC
  Administered 2014-06-25 – 2014-06-27 (×3): 1 via ORAL
  Filled 2014-06-25 (×3): qty 1

## 2014-06-25 MED ORDER — IBUPROFEN 600 MG PO TABS
600.0000 mg | ORAL_TABLET | Freq: Four times a day (QID) | ORAL | Status: DC
  Administered 2014-06-25 – 2014-06-27 (×10): 600 mg via ORAL
  Filled 2014-06-25 (×10): qty 1

## 2014-06-25 MED ORDER — SENNOSIDES-DOCUSATE SODIUM 8.6-50 MG PO TABS
2.0000 | ORAL_TABLET | ORAL | Status: DC
  Administered 2014-06-25 – 2014-06-26 (×2): 2 via ORAL
  Filled 2014-06-25 (×2): qty 2

## 2014-06-25 MED ORDER — TETANUS-DIPHTH-ACELL PERTUSSIS 5-2.5-18.5 LF-MCG/0.5 IM SUSP
0.5000 mL | Freq: Once | INTRAMUSCULAR | Status: AC
  Administered 2014-06-25: 0.5 mL via INTRAMUSCULAR
  Filled 2014-06-25: qty 0.5

## 2014-06-25 MED ORDER — ONDANSETRON HCL 4 MG/2ML IJ SOLN: 4.0000 mg | INTRAMUSCULAR | Status: DC | PRN

## 2014-06-25 MED ORDER — SIMETHICONE 80 MG PO CHEW: 80.0000 mg | CHEWABLE_TABLET | ORAL | Status: DC | PRN

## 2014-06-25 MED ORDER — ZOLPIDEM TARTRATE 5 MG PO TABS
5.0000 mg | ORAL_TABLET | Freq: Every evening | ORAL | Status: DC | PRN
Start: 2014-06-25 — End: 2014-06-27

## 2014-06-25 MED ORDER — LANOLIN HYDROUS EX OINT: TOPICAL_OINTMENT | CUTANEOUS | Status: DC | PRN

## 2014-06-25 MED ORDER — OXYCODONE-ACETAMINOPHEN 5-325 MG PO TABS: 1.0000 | ORAL_TABLET | ORAL | Status: DC | PRN

## 2014-06-25 MED ORDER — DIPHENHYDRAMINE HCL 25 MG PO CAPS: 25.0000 mg | ORAL_CAPSULE | Freq: Four times a day (QID) | ORAL | Status: DC | PRN

## 2014-06-25 MED ORDER — DIBUCAINE 1 % RE OINT: 1.0000 "application " | TOPICAL_OINTMENT | RECTAL | Status: DC | PRN

## 2014-06-25 MED ORDER — ONDANSETRON HCL 4 MG PO TABS
4.0000 mg | ORAL_TABLET | ORAL | Status: DC | PRN
Start: 2014-06-25 — End: 2014-06-27

## 2014-06-25 MED ORDER — OXYCODONE-ACETAMINOPHEN 5-325 MG PO TABS: 2.0000 | ORAL_TABLET | ORAL | Status: DC | PRN

## 2014-06-25 MED ORDER — BENZOCAINE-MENTHOL 20-0.5 % EX AERO
1.0000 "application " | INHALATION_SPRAY | CUTANEOUS | Status: DC | PRN
  Administered 2014-06-25: 1 via TOPICAL
  Filled 2014-06-25: qty 56

## 2014-06-25 MED ORDER — OXYTOCIN 40 UNITS IN LACTATED RINGERS INFUSION - SIMPLE MED: 62.5000 mL/h | INTRAVENOUS | Status: DC | PRN

## 2014-06-25 MED ORDER — WITCH HAZEL-GLYCERIN EX PADS: 1.0000 "application " | MEDICATED_PAD | CUTANEOUS | Status: DC | PRN

## 2014-06-25 NOTE — Anesthesia Postprocedure Evaluation (Signed)
  Anesthesia Post-op Note  Patient: Tina [name]  Procedure(s) Performed: * No procedures listed *  Patient Location: Mother/Baby  Anesthesia Type:Epidural  Level of Consciousness: awake  Airway and Oxygen Therapy: Patient Spontanous Breathing  Post-op Pain: mild  Post-op Assessment: Patient's Cardiovascular Status Stable and Respiratory Function Stable  Post-op Vital Signs: stable  Last Vitals:  Filed Vitals:   06/25/14 0415  BP: 109/56  Pulse: 86  Temp: 36.9 C  Resp: 20    Complications: No apparent anesthesia complications

## 2014-06-25 NOTE — Progress Notes (Signed)
Post Partum Day 0 Subjective: no complaints  Objective: Blood pressure 120/69, pulse 101, temperature 98.4 F (36.9 C), temperature source Oral, resp. rate 20, height 5\' 3"  (1.6 m), weight 199 lb (90.266 kg), last menstrual period 09/09/2013, unknown if currently breastfeeding.  Physical Exam:  General: alert and no distress Lochia: appropriate Uterine Fundus: firm Incision: none DVT Evaluation: No evidence of DVT seen on physical exam.   Recent Labs  06/22/14 0740 06/25/14 0600  HGB 9.9* 9.8*  HCT 30.3* 30.1*    Assessment/Plan: Doing well.  Routine care.   LOS: 3 days   Nishka Heide A 06/25/2014, 6:23 AM

## 2014-06-25 NOTE — Lactation Note (Signed)
This note was copied from the chart of Tina [name]. Lactation Consultation Note  Initial visit made.  Breastfeeding consultation services and support information given and reviewed.  Mom states baby has latched twice with nipple shield but sleepy past few hours.  Waking techniques done and baby placed skin to skin in football hold.  Colostrum easily hand expressed onto baby's lips.  Attempted latch but baby showing no signs of hunger.  Reassured mom and instructed to watch for feeding cues.  Encouraged to call for assist when baby cues.  Patient Name: Tina Talbert ForestCharlise Neider ZHYQM'VToday's Date: 06/25/2014 Reason for consult: Initial assessment;Difficult latch   Maternal Data Has patient been taught Hand Expression?: Yes  Feeding Feeding Type: Breast Fed  LATCH Score/Interventions Latch: Too sleepy or reluctant, no latch achieved, no sucking elicited. Intervention(s): Skin to skin;Teach feeding cues;Waking techniques Intervention(s): Adjust position;Assist with latch;Breast massage;Breast compression  Audible Swallowing: None  Type of Nipple: Flat Intervention(s): Hand pump  Comfort (Breast/Nipple): Soft / non-tender     Hold (Positioning): Assistance needed to correctly position infant at breast and maintain latch. Intervention(s): Breastfeeding basics reviewed;Support Pillows;Position options;Skin to skin  LATCH Score: 4  Lactation Tools Discussed/Used Date initiated:: 06/26/14   Consult Status Consult Status: Follow-up    Huston FoleyMOULDEN, Jametta Moorehead S 06/25/2014, 1:57 PM

## 2014-06-26 ENCOUNTER — Inpatient Hospital Stay (HOSPITAL_COMMUNITY): Payer: No Typology Code available for payment source

## 2014-06-26 MED ORDER — INFLUENZA VAC SPLIT QUAD 0.5 ML IM SUSY
0.5000 mL | PREFILLED_SYRINGE | Freq: Once | INTRAMUSCULAR | Status: AC
  Administered 2014-06-26: 0.5 mL via INTRAMUSCULAR

## 2014-06-26 NOTE — Progress Notes (Signed)
Post Partum Day 1 Subjective: no complaints  Objective: Blood pressure 128/64, pulse 77, temperature 98.1 F (36.7 C), temperature source Oral, resp. rate 18, height 5\' 3"  (1.6 m), weight 199 lb (90.266 kg), last menstrual period 09/09/2013, SpO2 100 %, unknown if currently breastfeeding.  Physical Exam:  General: alert and no distress Lochia: appropriate Uterine Fundus: firm Incision: none DVT Evaluation: No evidence of DVT seen on physical exam.   Recent Labs  06/25/14 0600  HGB 9.8*  HCT 30.1*    Assessment/Plan: Plan for discharge tomorrow   LOS: 4 days   Tina [name] A 06/26/2014, 8:56 AM

## 2014-06-26 NOTE — Lactation Note (Signed)
This note was copied from the chart of [facility]. Lactation Consultation Note Mom BF using NS #20. Appears a little large. Mom states comfortable. Fitted #16NS. fits well. Attempted latch but baby was finished BF and sleepy. Noted colostrum in NS. Hand expressed 2ml and gave in syring to stimulate baby to BF w/#16NS.  Mom demonstrated putting NS on correctly. Mom BF w/blankets and tshirt on. Encouraged to BF STS if possible, but to at least unwrap blankets. States she feels like things are well and they are both learning. Patient Name: Tina [name] WUJWJ'XToday's Date: 06/26/2014 Reason for consult: Follow-up assessment   Maternal Data    Feeding Feeding Type: Breast Fed Length of feed: 10 min  LATCH Score/Interventions Latch: Repeated attempts needed to sustain latch, nipple held in mouth throughout feeding, stimulation needed to elicit sucking reflex. Intervention(s): Teach feeding cues Intervention(s): Adjust position;Assist with latch;Breast massage;Breast compression  Audible Swallowing: A few with stimulation Intervention(s): Hand expression Intervention(s): Hand expression;Alternate breast massage  Type of Nipple: Everted at rest and after stimulation (short shaft/cone shaped) Intervention(s): Shells;Hand pump  Comfort (Breast/Nipple): Filling, red/small blisters or bruises, mild/mod discomfort  Problem noted: Mild/Moderate discomfort Interventions (Mild/moderate discomfort): Comfort gels  Intervention(s): Breastfeeding basics reviewed;Support Pillows;Position options;Skin to skin     Lactation Tools Discussed/Used Tools: Shells;Pump;Comfort gels Nipple shield size: 16;20 Shell Type: Inverted Breast pump type: Manual   Consult Status Consult Status: Follow-up Date: 06/27/14 Follow-up type: In-patient    Charyl DancerCARVER, Tina [name] 06/26/2014, 3:41 PM

## 2014-06-27 MED ORDER — IBUPROFEN 600 MG PO TABS: 600.0000 mg | ORAL_TABLET | Freq: Four times a day (QID) | ORAL | Status: DC | PRN

## 2014-06-27 MED ORDER — OXYCODONE-ACETAMINOPHEN 5-325 MG PO TABS: 2.0000 | ORAL_TABLET | ORAL | Status: DC | PRN

## 2014-06-27 NOTE — Lactation Note (Signed)
This note was copied from the chart of [facility]. Lactation Consultation Note  Patient Name: Tina [name] UUVOZ'DToday's Date: 06/27/2014 Reason for consult: Follow-up assessment;Breast/nipple pain;Difficult latch;Hyperbilirubinemia Asked by RN to help Mom with latching baby.  Mom has been using a nipple shield (16 and 20 mm) with latching.  Baby having serum bilirubin drawn, as baby's bili last evening was borderline for phototherapy.  Baby has had only one stool since birth, baby 158 hrs old.  Mom noted to have short nipples that pull out easily with some manual pumping.  Colostrum easy to manually express.  Left nipple, Mom says, was inverted, but is not everted with some cracks noted.  Mom was given Comfort Gels for this.  Assist with latching on left breast.  Initiated a 16mm nipple shield, and instilled some expressed colostrum into shield.  Instructed Mom on best hand placement and importance of firm breast support during latch and feeding.  Baby latched onto nipple only.  Tried with 20 mm nipple shield and continue with the same shallow latch.  Assisted Mom in latching without a nipple shield.  Baby latched easily, and deeply.  Multiple swallowing heard.  Mom denied any discomfort.  LS-7 (due to prior cracking noted on left nipple) Teaching done while baby feeding.  May need to use a nipple shield on the left side.  To assist with trying to latch without as well.  Mom to stay until this afternoon (waiting on ride), and waiting on serum bilirubin result.    Consult Status Consult Status: Follow-up Date: 06/27/14 Follow-up type: In-patient    Tina [name], Tina [name] 06/27/2014, 10:51 AM

## 2014-06-27 NOTE — Lactation Note (Signed)
This note was copied from the [facility] Lactation Consultation Note  Baby has not voided in over 17 hours.  I observed her BF and saw some good jaw movements but did not hear many swallows.  Mom denies pain with feeding.  Baby has a thick posterior frenula and this may be affecting BF. I recommended to mom that she BF and follow feedings with formula or expressed BM per volume guidelines.  I also recommended post pumping. A double electric breast pump was set up at the bedside with instructions on use, cleaning and storage.  Plan is for mom to get a double electric breast pump soon after use. An outpatient appointment was offered but mom prefers to call and schedule it later.  Patient Name: Girl Talbert ForestCharlise Wyre ZOXWR'UToday's Date: 06/27/2014     Maternal Data    Feeding Feeding Type: Bottle Fed - Formula Nipple Type: Slow - flow Length of feed: 15 min  LATCH Score/Interventions Latch: Grasps breast easily, tongue down, lips flanged, rhythmical sucking.  Audible Swallowing: A few with stimulation  Type of Nipple: Everted at rest and after stimulation  Comfort (Breast/Nipple): Soft / non-tender     Hold (Positioning): No assistance needed to correctly position infant at breast.  LATCH Score: 9  Lactation Tools Discussed/Used     Consult Status      Soyla DryerJoseph, Greyden Besecker 06/27/2014, 4:27 PM

## 2014-06-27 NOTE — Discharge Summary (Signed)
Obstetric Discharge Summary Reason for Admission: induction of labor Prenatal Procedures: ultrasound Intrapartum Procedures: spontaneous vaginal delivery Postpartum Procedures: none Complications-Operative and Postpartum: none HEMOGLOBIN  Date Value Ref Range Status  06/25/2014 9.8* 12.0 - 15.0 g/dL Final   HCT  Date Value Ref Range Status  06/25/2014 30.1* 36.0 - 46.0 % Final    Physical Exam:  General: alert and no distress Lochia: appropriate Uterine Fundus: firm Incision: none DVT Evaluation: No evidence of DVT seen on physical exam.  Discharge Diagnoses: Term Pregnancy-delivered  Discharge Information: Date: 06/27/2014 Activity: pelvic rest Diet: routine Medications: PNV, Ibuprofen, Colace and Percocet Condition: stable Instructions: refer to practice specific booklet Discharge to: home Follow-up Information    Follow up with HARPER,CHARLES A, MD. Schedule an appointment as soon as possible for a visit in 2 weeks.   Specialty:  Obstetrics and Gynecology   Contact information:   [address] [place] [address] [phone]       Newborn Data: Live born female  Birth Weight: 7 lb 2.8 oz (3255 g) APGAR: 8, 9  Home with mother.  HARPER,CHARLES A 06/27/2014, 8:41 AM

## 2014-06-27 NOTE — Progress Notes (Signed)
Post Partum Day 2 Subjective: no complaints  Objective: Blood pressure 116/76, pulse 69, temperature 98.1 F (36.7 C), temperature source Oral, resp. rate 18, height 5\' 3"  (1.6 m), weight 199 lb (90.266 kg), last menstrual period 09/09/2013, SpO2 100 %, unknown if currently breastfeeding.  Physical Exam:  General: alert and no distress Lochia: appropriate Uterine Fundus: firm Incision: none DVT Evaluation: No evidence of DVT seen on physical exam.   Recent Labs  06/25/14 0600  HGB 9.8*  HCT 30.1*    Assessment/Plan: Discharge home   LOS: 5 days   Leanza Shepperson A 06/27/2014, 8:38 AM

## 2014-06-28 ENCOUNTER — Ambulatory Visit: Payer: Self-pay

## 2014-06-28 NOTE — Lactation Note (Signed)
This note was copied from the chart of [facility] Lactation Consultation Note  Follow up visit made prior to discharge.  Mom states it has been about 4 hours since the last feeding.  Waking techniques done and baby placed skin to skin in football hold.  Breasts are filling and transitional milk easily hand expressed.  Mom using manual pump to evert nipple.  Baby opened wide and latched easily.  Observed baby actively nursing with many swallows for 15 minutes. Baby was still at breast when I left.  A lot of teaching done.  Mom knows if baby doesn't latch or feeds poorly she needs to pump to maintain milk supply.  I left my phone number with her to call for assist if needed prior to discharge.  Outpatient services/support information reviewed and encouraged.  Mom states she will call for appointment if needed.  Patient Name: Tina [name] ZOXWR'UToday'[name] Date: 06/28/2014 Reason for consult: Follow-up assessment;Difficult latch   Maternal Data    Feeding Feeding Type: Breast Fed Length of feed: 0 min  LATCH Score/Interventions Latch: Grasps breast easily, tongue down, lips flanged, rhythmical sucking. [name]: Skin to skin;Teach feeding cues;Waking techniques [name]: Breast compression;Breast massage;Assist with latch;Adjust position  Audible Swallowing: Spontaneous and intermittent [name]: Hand expression;Skin to skin [name]: Hand expression;Alternate breast massage  Type of Nipple: Everted at rest and after stimulation [name]: Hand pump  Comfort (Breast/Nipple): Soft / non-tender     Hold (Positioning): Assistance needed to correctly position infant at breast and maintain latch. [name]: Breastfeeding basics reviewed;Support Pillows;Position options;Skin to skin  LATCH Score: 9  Lactation Tools Discussed/Used     Consult Status      Tina [name], Tina [name] 06/28/2014, 10:47 AM

## 2014-07-23 ENCOUNTER — Telehealth: Payer: Self-pay | Admitting: *Deleted

## 2014-07-23 ENCOUNTER — Encounter: Payer: Self-pay | Admitting: *Deleted

## 2014-07-23 NOTE — Telephone Encounter (Signed)
Patient contacted the office requesting a letter to return to work on Jul 30, 2014. Patient advised would call when letter was ready for pick up.

## 2014-08-06 ENCOUNTER — Encounter: Payer: Self-pay | Admitting: Obstetrics

## 2014-08-06 ENCOUNTER — Ambulatory Visit (INDEPENDENT_AMBULATORY_CARE_PROVIDER_SITE_OTHER): Admitting: Obstetrics

## 2014-08-06 DIAGNOSIS — R87610 Atypical squamous cells of undetermined significance on cytologic smear of cervix (ASC-US): Secondary | ICD-10-CM

## 2014-08-06 DIAGNOSIS — Z3009 Encounter for other general counseling and advice on contraception: Secondary | ICD-10-CM

## 2014-08-06 NOTE — Progress Notes (Signed)
Subjective:     Tina ForestCharlise [name] is a 24 y.o. female who presents for a postpartum visit. She is 6 weeks postpartum following a spontaneous vaginal delivery. I have fully reviewed the prenatal and intrapartum course. The delivery was at 41 gestational weeks. Outcome: spontaneous vaginal delivery. Anesthesia: epidural. Postpartum course has been normal. Baby's course has been normal. Baby is feeding by both breast and bottle - Similac Alimentum. Bleeding no bleeding. Bowel function is normal. Bladder function is normal. Patient is not sexually active. Contraception method is none. Postpartum depression screening: negative.  Tobacco, alcohol and substance abuse history reviewed.  Adult immunizations reviewed including TDAP, rubella and varicella.  The following portions of the patient's history were reviewed and updated as appropriate: allergies, current medications, past family history, past medical history, past social history, past surgical history and problem list.  Review of Systems A comprehensive review of systems was negative.   Objective:    BP 128/84 mmHg  Pulse 87  Wt 178 lb (80.74 kg)  General:  alert and no distress   Breasts:  inspection negative, no nipple discharge or bleeding, no masses or nodularity palpable  Lungs: clear to auscultation bilaterally  Heart:  regular rate and rhythm, S1, S2 normal, no murmur, click, rub or gallop  Abdomen: normal findings: soft, non-tender   Vulva:  normal  Vagina: normal vagina  Cervix:  no cervical motion tenderness  Corpus: normal size, contour, position, consistency, mobility, non-tender  Adnexa:  no mass, fullness, tenderness  Rectal Exam: Not performed.           Assessment:     Normal postpartum exam. Pap smear not done at today's visit.     Contraceptive counseling. Plan:    1. Contraception: none.  Considering options. 2. Continue PNV's 3. Follow up in: 6 months or as needed.  Pap smear next visit. ( ASCUS on previous pap.   Reflex HPV DNA testing not done )  Healthy lifestyle practices reviewed

## 2014-10-08 ENCOUNTER — Encounter: Payer: Self-pay | Admitting: *Deleted

## 2014-10-09 ENCOUNTER — Telehealth: Payer: Self-pay | Admitting: Obstetrics

## 2014-10-09 NOTE — Telephone Encounter (Signed)
CLOSE ENCOUNTER °

## 2015-02-06 ENCOUNTER — Ambulatory Visit: Admitting: Obstetrics

## 2015-02-07 ENCOUNTER — Ambulatory Visit (INDEPENDENT_AMBULATORY_CARE_PROVIDER_SITE_OTHER): Admitting: Obstetrics

## 2015-02-07 ENCOUNTER — Encounter: Payer: Self-pay | Admitting: Obstetrics

## 2015-02-07 VITALS — BP 139/76 | HR 105 | Wt 188.0 lb

## 2015-02-07 DIAGNOSIS — Z124 Encounter for screening for malignant neoplasm of cervix: Secondary | ICD-10-CM | POA: Diagnosis not present

## 2015-02-07 DIAGNOSIS — Z3009 Encounter for other general counseling and advice on contraception: Secondary | ICD-10-CM

## 2015-02-07 DIAGNOSIS — Z01419 Encounter for gynecological examination (general) (routine) without abnormal findings: Secondary | ICD-10-CM

## 2015-02-07 NOTE — Progress Notes (Signed)
Patient ID: Tina [name], female   DOB: December 12, 1990, 24 y.o.   MRN: [id number]  Chief Complaint  Patient presents with  . Gynecologic Exam    discuss BC    HPI Tina ForestCharlise Closson is a 24 y.o. female.  H/O abnormal pap a year ago ( ASCUS ).  Presents today for repeat pap.  No complaints.  Would also like to discuss birth control, and start a contraceptive method. HPI  Past Medical History  Diagnosis Date  . Medical history non-contributory     Past Surgical History  Procedure Laterality Date  . Nasal cyst removal    . Right arm surgery      Rod and screws placed    Family History  Problem Relation Age of Onset  . Cancer Mother   . Diabetes Father   . Hypertension Father     Social History Social History  Substance Use Topics  . Smoking status: Never Smoker   . Smokeless tobacco: Never Used  . Alcohol Use: No    No Known Allergies  Current Outpatient Prescriptions  Medication Sig Dispense Refill  . Prenatal Multivit-Min-Fe-FA (PRENATAL VITAMINS PO) Take 1 tablet by mouth daily.     Marland Kitchen. ibuprofen (ADVIL,MOTRIN) 600 MG tablet Take 1 tablet (600 mg total) by mouth every 6 (six) hours as needed for mild pain. (Patient not taking: Reported on 08/06/2014) 30 tablet 5  . oxyCODONE-acetaminophen (PERCOCET/ROXICET) 5-325 MG per tablet Take 2 tablets by mouth every 4 (four) hours as needed for moderate pain or severe pain (for pain scale greater than 7). (Patient not taking: Reported on 08/06/2014) 40 tablet 0   No current facility-administered medications for this visit.    Review of Systems Review of Systems Constitutional: negative for fatigue and weight loss Respiratory: negative for cough and wheezing Cardiovascular: negative for chest pain, fatigue and palpitations Gastrointestinal: negative for abdominal pain and change in bowel habits Genitourinary:negative Integument/breast: negative for nipple discharge Musculoskeletal:negative for myalgias Neurological: negative for  gait problems and tremors Behavioral/Psych: negative for abusive relationship, depression Endocrine: negative for temperature intolerance     Blood pressure 139/76, pulse 105, weight 188 lb (85.276 kg), last menstrual period 01/16/2015, unknown if currently breastfeeding.  Physical Exam Physical Exam:          General:  Alert and no distress Abdomen:  normal findings: no organomegaly, soft, non-tender and no hernia  Pelvis:  External genitalia: normal general appearance Urinary system: urethral meatus normal and bladder without fullness, nontender Vaginal: normal without tenderness, induration or masses Cervix: normal appearance Adnexa: normal bimanual exam Uterus: anteverted and non-tender, normal size    Data Reviewed Previous pap smear  Assessment     ASCUS.  1st abnormal pap.  Contraceptive counseling and advice    Plan    Repeat pap 1 year if pap today is normal.  Colposcopy if pap abnormal today. Contraceptive options discussed.  Considering OCP's.  Orders Placed This Encounter  Procedures  . SureSwab, Vaginosis/Vaginitis Plus   No orders of the defined types were placed in this encounter.

## 2015-02-11 LAB — SURESWAB, VAGINOSIS/VAGINITIS PLUS
Atopobium vaginae: NOT DETECTED Log (cells/mL)
C. GLABRATA, DNA: NOT DETECTED
C. albicans, DNA: NOT DETECTED
C. parapsilosis, DNA: NOT DETECTED
C. trachomatis RNA, TMA: NOT DETECTED
C. tropicalis, DNA: NOT DETECTED
GARDNERELLA VAGINALIS: 5.6 Log (cells/mL)
LACTOBACILLUS SPECIES: NOT DETECTED Log (cells/mL)
MEGASPHAERA SPECIES: NOT DETECTED Log (cells/mL)
N. gonorrhoeae RNA, TMA: NOT DETECTED
T. VAGINALIS RNA, QL TMA: NOT DETECTED

## 2015-02-11 LAB — PAP IG W/ RFLX HPV ASCU

## 2015-09-16 ENCOUNTER — Encounter: Admitting: Obstetrics

## 2015-09-18 ENCOUNTER — Ambulatory Visit (INDEPENDENT_AMBULATORY_CARE_PROVIDER_SITE_OTHER): Admitting: Certified Nurse Midwife

## 2015-09-18 ENCOUNTER — Encounter: Payer: Self-pay | Admitting: Certified Nurse Midwife

## 2015-09-18 VITALS — BP 118/67 | HR 76 | Wt 187.0 lb

## 2015-09-18 DIAGNOSIS — Z3491 Encounter for supervision of normal pregnancy, unspecified, first trimester: Secondary | ICD-10-CM | POA: Diagnosis not present

## 2015-09-18 DIAGNOSIS — Z1389 Encounter for screening for other disorder: Secondary | ICD-10-CM | POA: Diagnosis not present

## 2015-09-18 DIAGNOSIS — Z331 Pregnant state, incidental: Secondary | ICD-10-CM

## 2015-09-18 DIAGNOSIS — Z3480 Encounter for supervision of other normal pregnancy, unspecified trimester: Secondary | ICD-10-CM | POA: Insufficient documentation

## 2015-09-18 DIAGNOSIS — Z3687 Encounter for antenatal screening for uncertain dates: Secondary | ICD-10-CM

## 2015-09-18 DIAGNOSIS — Z3481 Encounter for supervision of other normal pregnancy, first trimester: Secondary | ICD-10-CM

## 2015-09-18 LAB — POCT URINALYSIS DIPSTICK
Bilirubin, UA: NEGATIVE
GLUCOSE UA: NEGATIVE
Ketones, UA: NEGATIVE
Leukocytes, UA: NEGATIVE
NITRITE UA: NEGATIVE
PH UA: 6
PROTEIN UA: NEGATIVE
RBC UA: NEGATIVE
Spec Grav, UA: <=1.005
Urobilinogen, UA: NEGATIVE

## 2015-09-18 MED ORDER — VITAFOL GUMMIES 3.33-0.333-34.8 MG PO CHEW: 3.0000 | CHEWABLE_TABLET | Freq: Every day | ORAL | Status: DC

## 2015-09-18 NOTE — Addendum Note (Signed)
Addended by: Marya LandryFOSTER, Stuti Sandin D on: 09/18/2015 04:21 PM   Modules accepted: Orders

## 2015-09-18 NOTE — Progress Notes (Signed)
Subjective:    Tina [name] is being seen today for her first obstetrical visit.  This is not a planned pregnancy. She is at [redacted]w[redacted]d gestation. Her obstetrical history is significant for none. Relationship with FOB: spouse, living together. Patient does intend to breast feed. Pregnancy history fully reviewed.  The information documented in the HPI was reviewed and verified.  Menstrual History: OB History    Gravida Para Term Preterm AB TAB SAB Ectopic Multiple Living   3 1 1  0 1 0 0 0 0 1      Menarche age: 88-55 years of age  Patient's last menstrual period was 07/08/2015 (lmp unknown).    Past Medical History  Diagnosis Date  . Medical history non-contributory     Past Surgical History  Procedure Laterality Date  . Nasal cyst removal    . Right arm surgery      Rod and screws placed     (Not in a hospital admission) No Known Allergies  Social History  Substance Use Topics  . Smoking status: Never Smoker   . Smokeless tobacco: Never Used  . Alcohol Use: No    Family History  Problem Relation Age of Onset  . Cancer Mother   . Diabetes Father   . Hypertension Father      Review of Systems Constitutional: negative for weight loss Gastrointestinal: negative for vomiting, - vomiting Genitourinary:negative for genital lesions and vaginal discharge and dysuria Musculoskeletal:negative for back pain Behavioral/Psych: negative for abusive relationship, depression, illegal drug usage and tobacco use    Objective:    BP 118/67 mmHg  Pulse 76  Wt 187 lb (84.823 kg)  LMP 07/08/2015 (LMP Unknown) General Appearance:    Alert, cooperative, no distress, appears stated age  Head:    Normocephalic, without obvious abnormality, atraumatic  Eyes:    PERRL, conjunctiva/corneas clear, EOM's intact, fundi    benign, both eyes  Ears:    Normal TM's and external ear canals, both ears  Nose:   Nares normal, septum midline, mucosa normal, no drainage    or sinus tenderness  Throat:    Lips, mucosa, and tongue normal; teeth and gums normal  Neck:   Supple, symmetrical, trachea midline, no adenopathy;    thyroid:  no enlargement/tenderness/nodules; no carotid   bruit or JVD  Back:     Symmetric, no curvature, ROM normal, no CVA tenderness  Lungs:     Clear to auscultation bilaterally, respirations unlabored  Chest Wall:    No tenderness or deformity   Heart:    Regular rate and rhythm, S1 and S2 normal, no murmur, rub   or gallop  Breast Exam:    No tenderness, masses, or nipple abnormality  Abdomen:     Soft, non-tender, bowel sounds active all four quadrants,    no masses, no organomegaly  Genitalia:    Normal female without lesion, discharge or tenderness  Extremities:   Extremities normal, atraumatic, no cyanosis or edema  Pulses:   2+ and symmetric all extremities  Skin:   Skin color, texture, turgor normal, no rashes or lesions  Lymph nodes:   Cervical, supraclavicular, and axillary nodes normal  Neurologic:   CNII-XII intact, normal strength, sensation and reflexes    throughout       Cervix:  Long, thick, closed and posterior.      Lab Review Urine pregnancy test Labs reviewed yes Radiologic studies reviewed no Assessment:    Pregnancy at [redacted]w[redacted]d weeks   Unsure of LMP  Plan:      Prenatal vitamins.  Counseling provided regarding continued use of seat belts, cessation of alcohol consumption, smoking or use of illicit drugs; infection precautions i.e., influenza/TDAP immunizations, toxoplasmosis,CMV, parvovirus, listeria and varicella; workplace safety, exercise during pregnancy; routine dental care, safe medications, sexual activity, hot tubs, saunas, pools, travel, caffeine use, fish and methlymercury, potential toxins, hair treatments, varicose veins Weight gain recommendations per IOM guidelines reviewed: underweight/BMI< 18.5--> gain 28 - 40 lbs; normal weight/BMI 18.5 - 24.9--> gain 25 - 35 lbs; overweight/BMI 25 - 29.9--> gain 15 - 25 lbs; obese/BMI  >30->gain  11 - 20 lbs Problem list reviewed and updated. FIRST/CF mutation testing/NIPT/QUAD SCREEN/fragile X/Ashkenazi Jewish population testing/Spinal muscular atrophy discussed: ordered. Role of ultrasound in pregnancy discussed; fetal survey: requested. Amniocentesis discussed: not indicated. VBAC calculator score: VBAC consent form provided Meds ordered this encounter  Medications  . Prenatal Vit-Fe Phos-FA-Omega (VITAFOL GUMMIES) 3.33-0.333-34.8 MG CHEW    Sig: Chew 3 tablets by mouth daily.    Dispense:  90 tablet    Refill:  12   Orders Placed This Encounter  Procedures  . Culture, OB Urine  . US OB Comp Less 14 Wks    Standing Status: Future     Number of Occurrences:      Standing Expiration Date: 11/17/2016    Order Specific Question:  Reason for Exam (SYMPTOM  OR DIAGNOSIS REQUIRED)    Answer:  dating    Order Specific Question:  Preferred imaging location?    Answer:  Internal  . TSH  . HIV antibody  . Hemoglobinopathy evaluation  . Varicella zoster antibody, IgG  . Prenatal Profile I  . MaterniT21 PLUS Core+SCA    Order Specific Question:  Is the patient insulin dependent?    Answer:  No    Order Specific Question:  Please enter gestational age. This should be expressed as weeks AND days, i.e. 16w 6d. Enter weeks here. Enter days in next question.    Answer:  82    Order Specific Question:  Please enter gestational age. This should be expressed as weeks AND days, i.e. 16w 6d. Enter days here. Enter weeks in previous question.    Answer:  2    Order Specific Question:  How was gestational age calculated?    Answer:  LMP    Order Specific Question:  Please give the date of LMP OR Ultrasound OR Estimated date of delivery.    Answer:  04/13/2016    Order Specific Question:  Number of Fetuses (Type of Pregnancy):    Answer:  1    Order Specific Question:  Indications for performing the test? (please choose all that apply):    Answer:  Routine screening    Order  Specific Question:  Other Indications? (Y=Yes, N=No)    Answer:  N    Order Specific Question:  If this is a repeat specimen, please indicate the reason:    Answer:  Not indicated    Order Specific Question:  Please specify the patient's race: (C=White/Caucasion, B=Black, I=Native American, A=Asian, H=Hispanic, O=Other, U=Unknown)    Answer:  B    Order Specific Question:  Donor Egg - indicate if the egg was obtained from in vitro fertilization.    Answer:  N    Order Specific Question:  Age of Egg Donor.    Answer:  39    Order Specific Question:  Prior Down Syndrome/ONTD screening during current pregnancy.    Answer:  N  Order Specific Question:  Prior First Trimester Testing    Answer:  N    Order Specific Question:  Prior Second Trimester Testing    Answer:  N    Order Specific Question:  Family History of Neural Tube Defects    Answer:  N    Order Specific Question:  Prior Pregnancy with Down Syndrome    Answer:  N    Order Specific Question:  Please give the patient's weight (in pounds)    Answer:  168  . POCT urinalysis dipstick    Follow up in 4 weeks. 50% of 30 min visit spent on counseling and coordination of care.

## 2015-09-20 LAB — CULTURE, OB URINE

## 2015-09-20 LAB — URINE CULTURE, OB REFLEX

## 2015-09-21 LAB — NUSWAB VG+, CANDIDA 6SP
CANDIDA ALBICANS, NAA: NEGATIVE
CANDIDA GLABRATA, NAA: NEGATIVE
CANDIDA LUSITANIAE, NAA: NEGATIVE
CANDIDA PARAPSILOSIS, NAA: NEGATIVE
Candida krusei, NAA: NEGATIVE
Candida tropicalis, NAA: NEGATIVE
Chlamydia trachomatis, NAA: NEGATIVE
Neisseria gonorrhoeae, NAA: NEGATIVE
Trich vag by NAA: NEGATIVE

## 2015-09-26 ENCOUNTER — Other Ambulatory Visit: Payer: Self-pay | Admitting: Certified Nurse Midwife

## 2015-09-26 LAB — MATERNIT21 PLUS CORE+SCA
Chromosome 13: NEGATIVE
Chromosome 18: NEGATIVE
Chromosome 21: NEGATIVE
PDF: 0
Y Chromosome: NOT DETECTED

## 2015-09-26 LAB — PRENATAL PROFILE I(LABCORP)
ANTIBODY SCREEN: NEGATIVE
BASOS: 0 %
Basophils Absolute: 0 10*3/uL (ref 0.0–0.2)
EOS (ABSOLUTE): 0.1 10*3/uL (ref 0.0–0.4)
Eos: 1 %
HEMATOCRIT: 34.8 % (ref 34.0–46.6)
HEMOGLOBIN: 11.6 g/dL (ref 11.1–15.9)
HEP B S AG: NEGATIVE
IMMATURE GRANS (ABS): 0 10*3/uL (ref 0.0–0.1)
Immature Granulocytes: 0 %
LYMPHS ABS: 2.1 10*3/uL (ref 0.7–3.1)
Lymphs: 17 %
MCH: 29.2 pg (ref 26.6–33.0)
MCHC: 33.3 g/dL (ref 31.5–35.7)
MCV: 88 fL (ref 79–97)
MONOS ABS: 0.7 10*3/uL (ref 0.1–0.9)
Monocytes: 6 %
NEUTROS ABS: 9.4 10*3/uL — AB (ref 1.4–7.0)
Neutrophils: 76 %
Platelets: 276 10*3/uL (ref 150–379)
RBC: 3.97 x10E6/uL (ref 3.77–5.28)
RDW: 13.6 % (ref 12.3–15.4)
RPR: NONREACTIVE
RUBELLA: 5.82 {index} (ref >0.99)
Rh Factor: POSITIVE
WBC: 12.4 10*3/uL — AB (ref 3.4–10.8)

## 2015-09-26 LAB — HEMOGLOBINOPATHY EVALUATION
HEMOGLOBIN F QUANTITATION: 0.6 % (ref 0.0–2.0)
HGB C: 0 %
HGB S: 0 %
Hemoglobin A2 Quantitation: 2.3 % (ref 0.7–3.1)
Hgb A: 97.1 % (ref 94.0–98.0)

## 2015-09-26 LAB — HIV ANTIBODY (ROUTINE TESTING W REFLEX): HIV SCREEN 4TH GENERATION: NONREACTIVE

## 2015-09-26 LAB — VARICELLA ZOSTER ANTIBODY, IGG: Varicella zoster IgG: 1743 index (ref >165)

## 2015-09-26 LAB — TSH: TSH: 0.687 u[IU]/mL (ref 0.450–4.500)

## 2015-10-03 ENCOUNTER — Other Ambulatory Visit

## 2015-10-03 ENCOUNTER — Ambulatory Visit (INDEPENDENT_AMBULATORY_CARE_PROVIDER_SITE_OTHER)

## 2015-10-03 DIAGNOSIS — Z3687 Encounter for antenatal screening for uncertain dates: Secondary | ICD-10-CM

## 2015-10-03 DIAGNOSIS — O3680X1 Pregnancy with inconclusive fetal viability, fetus 1: Secondary | ICD-10-CM | POA: Diagnosis not present

## 2015-10-16 ENCOUNTER — Ambulatory Visit (INDEPENDENT_AMBULATORY_CARE_PROVIDER_SITE_OTHER): Admitting: Certified Nurse Midwife

## 2015-10-16 VITALS — BP 132/83 | HR 102 | Temp 98.6°F | Wt 186.0 lb

## 2015-10-16 DIAGNOSIS — Z3482 Encounter for supervision of other normal pregnancy, second trimester: Secondary | ICD-10-CM | POA: Diagnosis not present

## 2015-10-16 NOTE — Progress Notes (Signed)
  Subjective:    Tina ForestCharlise [name] is a 25 y.o. female being seen today for her obstetrical visit. She is at 3730w2d gestation. Patient reports: no bleeding, no contractions, no cramping, no leaking and round ligament pain.  Problem List Items Addressed This Visit    None    Visit Diagnoses    Supervision of other normal pregnancy, antepartum, second trimester    -  Primary    Relevant Orders    US OB Comp + 14 Wk      Patient Active Problem List   Diagnosis Date Noted  . Encounter for supervision of other normal pregnancy in first trimester 09/18/2015  . NSVD (normal spontaneous vaginal delivery) 06/25/2014  . Personal history of previous postdates pregnancy 06/22/2014  . ASCUS favor benign 12/02/2013  . Supervision of normal first pregnancy 11/29/2013    Objective:     BP 132/83 mmHg  Pulse 102  Temp(Src) 98.6 F (37 C)  Wt 186 lb (84.369 kg)  LMP 07/08/2015 (LMP Unknown) Uterine Size: Below umbilicus   FHR: 160 by doppler  Assessment:    Pregnancy @ 5930w2d  weeks Round ligament pain of pregnancy     Plan:    Problem list reviewed and updated. Labs reviewed.  Follow up in 4 weeks. FIRST/CF mutation testing/NIPT/QUAD SCREEN/fragile X/Ashkenazi Jewish population testing/Spinal muscular atrophy discussed: results reviewed. Role of ultrasound in pregnancy discussed; fetal survey: ordered. Amniocentesis discussed: not indicated. 50% of 15 minute visit spent on counseling and coordination of care.

## 2015-11-04 ENCOUNTER — Encounter: Payer: Self-pay | Admitting: *Deleted

## 2015-11-04 NOTE — Progress Notes (Signed)
Patient would like her confirmation letter Emailed to her at [email or web address] 

## 2015-11-14 ENCOUNTER — Ambulatory Visit (INDEPENDENT_AMBULATORY_CARE_PROVIDER_SITE_OTHER)

## 2015-11-14 ENCOUNTER — Other Ambulatory Visit: Payer: Self-pay | Admitting: Certified Nurse Midwife

## 2015-11-14 ENCOUNTER — Encounter: Admitting: Certified Nurse Midwife

## 2015-11-14 DIAGNOSIS — Z36 Encounter for antenatal screening of mother: Secondary | ICD-10-CM | POA: Diagnosis not present

## 2015-11-14 DIAGNOSIS — Z3482 Encounter for supervision of other normal pregnancy, second trimester: Secondary | ICD-10-CM

## 2015-11-14 DIAGNOSIS — Z3481 Encounter for supervision of other normal pregnancy, first trimester: Secondary | ICD-10-CM

## 2015-11-15 ENCOUNTER — Other Ambulatory Visit: Payer: Self-pay | Admitting: Certified Nurse Midwife

## 2015-11-19 ENCOUNTER — Ambulatory Visit (INDEPENDENT_AMBULATORY_CARE_PROVIDER_SITE_OTHER): Admitting: Certified Nurse Midwife

## 2015-11-19 ENCOUNTER — Encounter: Payer: Self-pay | Admitting: Certified Nurse Midwife

## 2015-11-19 VITALS — BP 110/71 | HR 80 | Wt 188.0 lb

## 2015-11-19 DIAGNOSIS — Z3482 Encounter for supervision of other normal pregnancy, second trimester: Secondary | ICD-10-CM | POA: Diagnosis not present

## 2015-11-19 DIAGNOSIS — Z1389 Encounter for screening for other disorder: Secondary | ICD-10-CM

## 2015-11-19 DIAGNOSIS — Z331 Pregnant state, incidental: Secondary | ICD-10-CM

## 2015-11-19 DIAGNOSIS — Z3481 Encounter for supervision of other normal pregnancy, first trimester: Secondary | ICD-10-CM

## 2015-11-19 LAB — POCT URINALYSIS DIPSTICK
Bilirubin, UA: NEGATIVE
Blood, UA: NEGATIVE
Glucose, UA: NEGATIVE
Ketones, UA: NEGATIVE
Leukocytes, UA: NEGATIVE
NITRITE UA: NEGATIVE
PH UA: 6.5
Protein, UA: NEGATIVE
SPEC GRAV UA: 1.015
Urobilinogen, UA: NEGATIVE

## 2015-11-19 NOTE — Progress Notes (Signed)
Subjective:    Tina [name] is a 25 y.o. female being seen today for her obstetrical visit. She is at 6736w1d gestation. Patient reports: no complaints . Fetal movement: normal.  Problem List Items Addressed This Visit      Other   Encounter for supervision of other normal pregnancy in first trimester    Other Visit Diagnoses    Encounter for supervision of other normal pregnancy in second trimester    -  Primary   Relevant Orders   POCT urinalysis dipstick (Completed)     Patient Active Problem List   Diagnosis Date Noted  . Encounter for supervision of other normal pregnancy in first trimester 09/18/2015  . NSVD (normal spontaneous vaginal delivery) 06/25/2014  . Personal history of previous postdates pregnancy 06/22/2014  . ASCUS favor benign 12/02/2013  . Supervision of normal first pregnancy 11/29/2013  . 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 Signs and symptoms of preterm labor: discussed.  Labs, problem list reviewed and updated 2 hr GTT planned Follow up in 4 weeks.

## 2015-11-19 NOTE — Patient Instructions (Addendum)
How a Baby Grows During Pregnancy Pregnancy begins when a female's sperm enters a female's egg (fertilization). This happens in one of the tubes (fallopian tubes) that connect the ovaries to the womb (uterus). The fertilized egg is called an embryo until it reaches 10 weeks. From 10 weeks until birth, it is called a fetus. The fertilized egg moves down the fallopian tube to the uterus. Then it implants into the lining of the uterus and begins to grow. The developing fetus receives oxygen and nutrients through the pregnant woman's bloodstream and the tissues that grow (placenta) to support the fetus. The placenta is the life support system for the fetus. It provides nutrition and removes waste. Learning as much as you can about your pregnancy and how your baby is developing can help you enjoy the experience. It can also make you aware of when there might be a problem and when to ask questions. HOW LONG DOES A TYPICAL PREGNANCY LAST? A pregnancy usually lasts 280 days, or about 40 weeks. Pregnancy is divided into three trimesters:  First trimester: 0-13 weeks.  Second trimester: 14-27 weeks.  Third trimester: 28-40 weeks. The day when your baby is considered ready to be born (full term) is your estimated date of delivery. HOW DOES MY BABY DEVELOP MONTH BY MONTH? First month  The fertilized egg attaches to the inside of the uterus.  Some cells will form the placenta. Others will form the fetus.  The arms, legs, brain, spinal cord, lungs, and heart begin to develop.  At the end of the first month, the heart begins to beat. Second month  The bones, inner ear, eyelids, hands, and feet form.  The genitals develop.  By the end of 8 weeks, all major organs are developing. Third month  All of the internal organs are forming.  Teeth develop below the gums.  Bones and muscles begin to grow. The spine can flex.  The skin is transparent.  Fingernails and toenails begin to form.  Arms and  legs continue to grow longer, and hands and feet develop.  The fetus is about 3 in (7.6 cm) long. Fourth month  The placenta is completely formed.  The external sex organs, neck, outer ear, eyebrows, eyelids, and fingernails are formed.  The fetus can hear, swallow, and move its arms and legs.  The kidneys begin to produce urine.  The skin is covered with a white waxy coating (vernix) and very fine hair (lanugo). Fifth month  The fetus moves around more and can be felt for the first time (quickening).  The fetus starts to sleep and wake up and may begin to suck its finger.  The nails grow to the end of the fingers.  The organ in the digestive system that makes bile (gallbladder) functions and helps to digest the nutrients.  If your baby is a girl, eggs are present in her ovaries. If your baby is a boy, testicles start to move down into his scrotum. Sixth month  The lungs are formed, but the fetus is not yet able to breathe.  The eyes open. The brain continues to develop.  Your baby has fingerprints and toe prints. Your baby's hair grows thicker.  At the end of the second trimester, the fetus is about 9 in (22.9 cm) long. Seventh month  The fetus kicks and stretches.  The eyes are developed enough to sense changes in light.  The hands can make a grasping motion.  The fetus responds to sound. Eighth month    All organs and body systems are fully developed and functioning.  Bones harden and taste buds develop. The fetus may hiccup.  Certain areas of the brain are still developing. The skull remains soft. Ninth month  The fetus gains about  lb (0.23 kg) each week.  The lungs are fully developed.  Patterns of sleep develop.  The fetus's head typically moves into a head-down position (vertex) in the uterus to prepare for birth. If the buttocks move into a vertex position instead, the baby is breech.  The fetus weighs 6-9 lbs (2.72-4.08 kg) and is 19-20 in  (48.26-50.8 cm) long. WHAT CAN I DO TO HAVE A HEALTHY PREGNANCY AND HELP MY BABY DEVELOP? Eating and Drinking  Eat a healthy diet.  Talk with your health care provider to make sure that you are getting the nutrients that you and your baby need.  Visit www.choosemyplate.gov to learn about creating a healthy diet.  Gain a healthy amount of weight during pregnancy as advised by your health care provider. This is usually 25-35 pounds. You may need to:  Gain more if you were underweight before getting pregnant or if you are pregnant with more than one baby.  Gain less if you were overweight or obese when you got pregnant. Medicines and Vitamins  Take prenatal vitamins as directed by your health care provider. These include vitamins such as folic acid, iron, calcium, and vitamin D. They are important for healthy development.  Take medicines only as directed by your health care provider. Read labels and ask a pharmacist or your health care provider whether over-the-counter medicines, supplements, and prescription drugs are safe to take during pregnancy. Activities  Be physically active as advised by your health care provider. Ask your health care provider to recommend activities that are safe for you to do, such as walking or swimming.  Do not participate in strenuous or extreme sports. Lifestyle  Do not drink alcohol.  Do not use any tobacco products, including cigarettes, chewing tobacco, or electronic cigarettes. If you need help quitting, ask your health care provider.  Do not use illegal drugs. Safety  Avoid exposure to mercury, lead, or other heavy metals. Ask your health care provider about common sources of these heavy metals.  Avoid listeria infection during pregnancy. Follow these precautions:  Do not eat soft cheeses or deli meats.  Do not eat hot dogs unless they have been warmed up to the point of steaming, such as in the microwave oven.  Do not drink unpasteurized  milk.  Avoid toxoplasmosis infection during pregnancy. Follow these precautions:  Do not change your cat's litter box, if you have a cat. Ask someone else to do this for you.  Wear gardening gloves while working in the yard. General Instructions  Keep all follow-up visits as directed by your health care provider. This is important. This includes prenatal care and screening tests.  Manage any chronic health conditions. Work closely with your health care provider to keep conditions, such as diabetes, under control. HOW DO I KNOW IF MY BABY IS DEVELOPING WELL? At each prenatal visit, your health care provider will do several different tests to check on your health and keep track of your baby's development. These include:  Fundal height.  Your health care provider will measure your growing belly from top to bottom using a tape measure.  Your health care provider will also feel your belly to determine your baby's position.  Heartbeat.  An ultrasound in the first trimester can confirm   pregnancy and show a heartbeat, depending on how far along you are.  Your health care provider will check your baby's heart rate at every prenatal visit.  As you get closer to your delivery date, you may have regular fetal heart rate monitoring to make sure that your baby is not in distress.  Second trimester ultrasound.  This ultrasound checks your baby's development. It also indicates your baby's gender. WHAT SHOULD I DO IF I HAVE CONCERNS ABOUT MY BABY'S DEVELOPMENT? Always talk with your health care provider about any concerns that you may have.   This information is not intended to replace advice given to you by your health care provider. Make sure you discuss any questions you have with your health care provider.   Document Released: 09/02/2007 Document Revised: 12/05/2014 Document Reviewed: 08/23/2013 Elsevier Interactive Patient Education 2016 [employer] Second Trimester of Pregnancy The  second trimester is from week 13 through week 28, months 4 through 6. The second trimester is often a time when you feel your best. Your body has also adjusted to being pregnant, and you begin to feel better physically. Usually, morning sickness has lessened or quit completely, you may have more energy, and you may have an increase in appetite. The second trimester is also a time when the fetus is growing rapidly. At the end of the sixth month, the fetus is about 9 inches long and weighs about 1 pounds. You will likely begin to feel the baby move (quickening) between 18 and 20 weeks of the pregnancy. BODY CHANGES Your body goes through many changes during pregnancy. The changes vary from woman to woman.   Your weight will continue to increase. You will notice your lower abdomen bulging out.  You may begin to get stretch marks on your hips, abdomen, and breasts.  You may develop headaches that can be relieved by medicines approved by your health care provider.  You may urinate more often because the fetus is pressing on your bladder.  You may develop or continue to have heartburn as a result of your pregnancy.  You may develop constipation because certain hormones are causing the muscles that push waste through your intestines to slow down.  You may develop hemorrhoids or swollen, bulging veins (varicose veins).  You may have back pain because of the weight gain and pregnancy hormones relaxing your joints between the bones in your pelvis and as a result of a shift in weight and the muscles that support your balance.  Your breasts will continue to grow and be tender.  Your gums may bleed and may be sensitive to brushing and flossing.  Dark spots or blotches (chloasma, mask of pregnancy) may develop on your face. This will likely fade after the baby is born.  A dark line from your belly button to the pubic area (linea nigra) may appear. This will likely fade after the baby is born.  You may  have changes in your hair. These can include thickening of your hair, rapid growth, and changes in texture. Some women also have hair loss during or after pregnancy, or hair that feels dry or thin. Your hair will most likely return to normal after your baby is born. WHAT TO EXPECT AT YOUR PRENATAL VISITS During a routine prenatal visit:  You will be weighed to make sure you and the fetus are growing normally.  Your blood pressure will be taken.  Your abdomen will be measured to track your baby's growth.  The fetal heartbeat will   be listened to.  Any test results from the previous visit will be discussed. Your health care provider may ask you:  How you are feeling.  If you are feeling the baby move.  If you have had any abnormal symptoms, such as leaking fluid, bleeding, severe headaches, or abdominal cramping.  If you are using any tobacco products, including cigarettes, chewing tobacco, and electronic cigarettes.  If you have any questions. Other tests that may be performed during your second trimester include:  Blood tests that check for:  Low iron levels (anemia).  Gestational diabetes (between 24 and 28 weeks).  Rh antibodies.  Urine tests to check for infections, diabetes, or protein in the urine.  An ultrasound to confirm the proper growth and development of the baby.  An amniocentesis to check for possible genetic problems.  Fetal screens for spina bifida and Down syndrome.  HIV (human immunodeficiency virus) testing. Routine prenatal testing includes screening for HIV, unless you choose not to have this test. HOME CARE INSTRUCTIONS   Avoid all smoking, herbs, alcohol, and unprescribed drugs. These chemicals affect the formation and growth of the baby.  Do not use any tobacco products, including cigarettes, chewing tobacco, and electronic cigarettes. If you need help quitting, ask your health care provider. You may receive counseling support and other resources  to help you quit.  Follow your health care provider's instructions regarding medicine use. There are medicines that are either safe or unsafe to take during pregnancy.  Exercise only as directed by your health care provider. Experiencing uterine cramps is a good sign to stop exercising.  Continue to eat regular, healthy meals.  Wear a good support bra for breast tenderness.  Do not use hot tubs, steam rooms, or saunas.  Wear your seat belt at all times when driving.  Avoid raw meat, uncooked cheese, cat litter boxes, and soil used by cats. These carry germs that can cause birth defects in the baby.  Take your prenatal vitamins.  Take 1500-2000 mg of calcium daily starting at the 20th week of pregnancy until you deliver your baby.  Try taking a stool softener (if your health care provider approves) if you develop constipation. Eat more high-fiber foods, such as fresh vegetables or fruit and whole grains. Drink plenty of fluids to keep your urine clear or pale yellow.  Take warm sitz baths to soothe any pain or discomfort caused by hemorrhoids. Use hemorrhoid cream if your health care provider approves.  If you develop varicose veins, wear support hose. Elevate your feet for 15 minutes, 3-4 times a day. Limit salt in your diet.  Avoid heavy lifting, wear low heel shoes, and practice good posture.  Rest with your legs elevated if you have leg cramps or low back pain.  Visit your dentist if you have not gone yet during your pregnancy. Use a soft toothbrush to brush your teeth and be gentle when you floss.  A sexual relationship may be continued unless your health care provider directs you otherwise.  Continue to go to all your prenatal visits as directed by your health care provider. SEEK MEDICAL CARE IF:   You have dizziness.  You have mild pelvic cramps, pelvic pressure, or nagging pain in the abdominal area.  You have persistent nausea, vomiting, or diarrhea.  You have a bad  smelling vaginal discharge.  You have pain with urination. SEEK IMMEDIATE MEDICAL CARE IF:   You have a fever.  You are leaking fluid from your vagina.  You   have spotting or bleeding from your vagina.  You have severe abdominal cramping or pain.  You have rapid weight gain or loss.  You have shortness of breath with chest pain.  You notice sudden or extreme swelling of your face, hands, ankles, feet, or legs.  You have not felt your baby move in over an hour.  You have severe headaches that do not go away with medicine.  You have vision changes.   This information is not intended to replace advice given to you by your health care provider. Make sure you discuss any questions you have with your health care provider.   Document Released: 03/10/2001 Document Revised: 04/06/2014 Document Reviewed: 05/17/2012 Elsevier Interactive Patient Education 2016 [employer] Inc[employer] Preterm Labor Information Preterm labor is when labor starts at less than 37 weeks of pregnancy. The normal length of a pregnancy is 39 to 41 weeks. CAUSES Often, there is no identifiable underlying cause as to why a woman goes into preterm labor. One of the most common known causes of preterm labor is infection. Infections of the uterus, cervix, vagina, amniotic sac, bladder, kidney, or even the lungs (pneumonia) can cause labor to start. Other suspected causes of preterm labor include:   Urogenital infections, such as yeast infections and bacterial vaginosis.   Uterine abnormalities (uterine shape, uterine septum, fibroids, or bleeding from the placenta).   A cervix that has been operated on (it may fail to stay closed).   Malformations in the fetus.   Multiple gestations (twins, triplets, and so on).   Breakage of the amniotic sac.  RISK FACTORS  Having a previous history of preterm labor.   Having premature rupture of membranes (PROM).   Having a placenta that covers the opening of the cervix  (placenta previa).   Having a placenta that separates from the uterus (placental abruption).   Having a cervix that is too weak to hold the fetus in the uterus (incompetent cervix).   Having too much fluid in the amniotic sac (polyhydramnios).   Taking illegal drugs or smoking while pregnant.   Not gaining enough weight while pregnant.   Being younger than 18 and older than 27 years old.   Having a low socioeconomic status.   Being African American. SYMPTOMS Signs and symptoms of preterm labor include:   Menstrual-like cramps, abdominal pain, or back pain.  Uterine contractions that are regular, as frequent as six in an hour, regardless of their intensity (may be mild or painful).  Contractions that start on the top of the uterus and spread down to the lower abdomen and back.   A sense of increased pelvic pressure.   A watery or bloody mucus discharge that comes from the vagina.  TREATMENT Depending on the length of the pregnancy and other circumstances, your health care provider may suggest bed rest. If necessary, there are medicines that can be given to stop contractions and to mature the fetal lungs. If labor happens before 34 weeks of pregnancy, a prolonged hospital stay may be recommended. Treatment depends on the condition of both you and the fetus.  WHAT SHOULD YOU DO IF YOU THINK YOU ARE IN PRETERM LABOR? Call your health care provider right away. You will need to go to the hospital to get checked immediately. HOW CAN YOU PREVENT PRETERM LABOR IN FUTURE PREGNANCIES? You should:   Stop smoking if you smoke.  Maintain healthy weight gain and avoid chemicals and drugs that are not necessary.  Be watchful for any type of   infection.  Inform your health care provider if you have a known history of preterm labor.   This information is not intended to replace advice given to you by your health care provider. Make sure you discuss any questions you have with  your health care provider.   Document Released: 06/06/2003 Document Revised: 11/16/2012 Document Reviewed: 04/18/2012 Elsevier Interactive Patient Education 2016 Else[employer] Inc[employer]  

## 2015-12-17 ENCOUNTER — Encounter: Payer: Self-pay | Admitting: Obstetrics and Gynecology

## 2015-12-17 ENCOUNTER — Ambulatory Visit (INDEPENDENT_AMBULATORY_CARE_PROVIDER_SITE_OTHER): Admitting: Obstetrics

## 2015-12-17 ENCOUNTER — Encounter: Payer: Self-pay | Admitting: Obstetrics

## 2015-12-17 VITALS — BP 109/77 | HR 90 | Wt 192.0 lb

## 2015-12-17 DIAGNOSIS — Z3492 Encounter for supervision of normal pregnancy, unspecified, second trimester: Secondary | ICD-10-CM

## 2015-12-17 NOTE — Progress Notes (Signed)
Subjective:    Tina [name] is a 25 y.o. female being seen today for her obstetrical visit. She is at 7633w1d gestation. Patient reports: no complaints . Fetal movement: normal.  Problem List Items Addressed This Visit    None    Visit Diagnoses   None.    Patient Active Problem List   Diagnosis Date Noted  . Encounter for supervision of other normal pregnancy in first trimester 09/18/2015  . NSVD (normal spontaneous vaginal delivery) 06/25/2014  . Personal history of previous postdates pregnancy 06/22/2014  . ASCUS favor benign 12/02/2013  . Supervision of normal first pregnancy 11/29/2013  . 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 Signs and symptoms of preterm labor: discussed.  Labs, problem list reviewed and updated 2 hr GTT planned Follow up in 4 weeks.  Patient ID: Tina ForestCharlise Saez, female   DOB: 1990/10/24, 25 y.o.   MRN: [id number]

## 2016-01-14 ENCOUNTER — Encounter: Payer: Self-pay | Admitting: Obstetrics

## 2016-01-14 ENCOUNTER — Other Ambulatory Visit

## 2016-01-14 ENCOUNTER — Ambulatory Visit (INDEPENDENT_AMBULATORY_CARE_PROVIDER_SITE_OTHER): Admitting: Obstetrics

## 2016-01-14 VITALS — BP 112/70 | HR 97 | Wt 198.7 lb

## 2016-01-14 DIAGNOSIS — Z3483 Encounter for supervision of other normal pregnancy, third trimester: Secondary | ICD-10-CM

## 2016-01-14 NOTE — Progress Notes (Signed)
Patient c/o light headness and dizziness after long periods of standing.

## 2016-01-14 NOTE — Progress Notes (Signed)
Subjective:    Tina ForestCharlise [name] is a 25 y.o. female being seen today for her obstetrical visit. She is at 4369w1d gestation. Patient reports: no complaints . Fetal movement: normal.  Problem List Items Addressed This Visit    Encounter for supervision of other normal pregnancy in first trimester - Primary    Other Visit Diagnoses   None.    Patient Active Problem List   Diagnosis Date Noted  . Encounter for supervision of other normal pregnancy in first trimester 09/18/2015  . NSVD (normal spontaneous vaginal delivery) 06/25/2014  . Personal history of previous postdates pregnancy 06/22/2014  . ASCUS favor benign 12/02/2013  . Supervision of normal first pregnancy 11/29/2013  . 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 Signs and symptoms of preterm labor: discussed.  Labs, problem list reviewed and updated 2 hr GTT planned Follow up in 2 weeks.

## 2016-01-15 LAB — HIV ANTIBODY (ROUTINE TESTING W REFLEX): HIV Screen 4th Generation wRfx: NONREACTIVE

## 2016-01-15 LAB — CBC
HEMATOCRIT: 31.2 % — AB (ref 34.0–46.6)
HEMOGLOBIN: 10 g/dL — AB (ref 11.1–15.9)
MCH: 27.3 pg (ref 26.6–33.0)
MCHC: 32.1 g/dL (ref 31.5–35.7)
MCV: 85 fL (ref 79–97)
Platelets: 277 10*3/uL (ref 150–379)
RBC: 3.66 x10E6/uL — ABNORMAL LOW (ref 3.77–5.28)
RDW: 14.1 % (ref 12.3–15.4)
WBC: 13.8 10*3/uL — ABNORMAL HIGH (ref 3.4–10.8)

## 2016-01-15 LAB — GLUCOSE TOLERANCE, 2 HOURS W/ 1HR
Glucose, 1 hour: 94 mg/dL (ref 65–179)
Glucose, 2 hour: 75 mg/dL (ref 65–152)
Glucose, Fasting: 68 mg/dL (ref 65–91)

## 2016-01-15 LAB — RPR: RPR Ser Ql: NONREACTIVE

## 2016-01-28 ENCOUNTER — Ambulatory Visit (INDEPENDENT_AMBULATORY_CARE_PROVIDER_SITE_OTHER): Admitting: Obstetrics and Gynecology

## 2016-01-28 ENCOUNTER — Encounter: Payer: Self-pay | Admitting: Obstetrics and Gynecology

## 2016-01-28 DIAGNOSIS — Z3483 Encounter for supervision of other normal pregnancy, third trimester: Secondary | ICD-10-CM

## 2016-01-28 DIAGNOSIS — O99013 Anemia complicating pregnancy, third trimester: Secondary | ICD-10-CM

## 2016-01-28 DIAGNOSIS — D649 Anemia, unspecified: Secondary | ICD-10-CM

## 2016-01-28 MED ORDER — FERROUS SULFATE 325 (65 FE) MG PO TABS: 325.0000 mg | ORAL_TABLET | Freq: Two times a day (BID) | ORAL | 1 refills | Status: DC

## 2016-01-28 NOTE — Progress Notes (Signed)
Subjective:  Tina [name] is a 25 y.o. G3P1011 at 239w1d being seen today for ongoing prenatal care.  She is currently monitored for the following issues for this low-risk pregnancy and has Personal history of previous postdates pregnancy and Anemia affecting pregnancy in third trimester on her problem list.  Patient reports no complaints.  Contractions: Not present. Vag. Bleeding: None.  Movement: Present. Denies leaking of fluid.   The following portions of the patient's history were reviewed and updated as appropriate: allergies, current medications, past family history, past medical history, past social history, past surgical history and problem list. Problem list updated.  Objective:   Vitals:   01/28/16 1349  BP: 125/80  Pulse: (!) 109  Weight: 196 lb (88.9 kg)    Fetal Status:   Fundal Height: 30 cm Movement: Present     General:  Alert, oriented and cooperative. Patient is in no acute distress.  Skin: Skin is warm and dry. No rash noted.   Cardiovascular: Normal heart rate noted  Respiratory: Normal respiratory effort, no problems with respiration noted  Abdomen: Soft, gravid, appropriate for gestational age. Pain/Pressure: Present     Pelvic:  Cervical exam deferred        Extremities: Normal range of motion.     Mental Status: Normal mood and affect. Normal behavior. Normal judgment and thought content.   Urinalysis:      Assessment and Plan:  Pregnancy: G3P1011 at 799w1d  1. Encounter for supervision of other normal pregnancy in third trimester Declines Flu vaccine Considering Tdap  2. Anemia affecting pregnancy in third trimester Iron supplement sent to pharmacy  Preterm labor symptoms and general obstetric precautions including but not limited to vaginal bleeding, contractions, leaking of fluid and fetal movement were reviewed in detail with the patient. Please refer to After Visit Summary for other counseling recommendations.  No Follow-up on file.   Hermina StaggersMichael  L Koltin Wehmeyer, MD

## 2016-02-11 ENCOUNTER — Ambulatory Visit (INDEPENDENT_AMBULATORY_CARE_PROVIDER_SITE_OTHER): Admitting: Certified Nurse Midwife

## 2016-02-11 DIAGNOSIS — Z3483 Encounter for supervision of other normal pregnancy, third trimester: Secondary | ICD-10-CM

## 2016-02-11 DIAGNOSIS — Z3480 Encounter for supervision of other normal pregnancy, unspecified trimester: Secondary | ICD-10-CM

## 2016-02-11 NOTE — Progress Notes (Signed)
Subjective:    Tina [name] is a 25 y.o. female being seen today for her obstetrical visit. She is at 5836w1d gestation. Patient reports no complaints. Fetal movement: normal.  Problem List Items Addressed This Visit    None     Patient Active Problem List   Diagnosis Date Noted  . Anemia affecting pregnancy in third trimester 01/28/2016  . Supervision of other normal pregnancy, antepartum 09/18/2015  . Personal history of previous postdates pregnancy 06/22/2014   Objective:    BP 112/74   Pulse 98   Wt 196 lb (88.9 kg)   LMP 07/08/2015 (LMP Unknown)   BMI 34.72 kg/m  FHT:  145 BPM  Uterine Size: 31 cm and size equals dates  Presentation: cephalic     Assessment:    Pregnancy @ 7036w1d weeks   Doing well  Plan:     labs reviewed, problem list updated Consent signed. GBS planning TDAP offered  Rhogam given for RH negative Pediatrician: discussed. Infant feeding: plans to breastfeed. Maternity leave: discussed, FMLA paperwork for spouse and patient. Cigarette smoking: never smoked. No orders of the defined types were placed in this encounter.  No orders of the defined types were placed in this encounter.  Follow up in 2 Weeks.

## 2016-02-23 ENCOUNTER — Emergency Department (HOSPITAL_BASED_OUTPATIENT_CLINIC_OR_DEPARTMENT_OTHER): Admit: 2016-02-23 | Discharge: 2016-02-23 | Disposition: A | Discharge status: Home / Self Care

## 2016-02-23 ENCOUNTER — Encounter (HOSPITAL_COMMUNITY): Payer: Self-pay

## 2016-02-23 ENCOUNTER — Other Ambulatory Visit: Payer: Self-pay

## 2016-02-23 ENCOUNTER — Emergency Department (HOSPITAL_COMMUNITY)
Admission: EM | Admit: 2016-02-23 | Discharge: 2016-02-23 | Disposition: A | Discharge status: Home / Self Care | Attending: Emergency Medicine | Admitting: Emergency Medicine

## 2016-02-23 DIAGNOSIS — R42 Dizziness and giddiness: Secondary | ICD-10-CM

## 2016-02-23 DIAGNOSIS — O26893 Other specified pregnancy related conditions, third trimester: Secondary | ICD-10-CM | POA: Diagnosis present

## 2016-02-23 DIAGNOSIS — Z3A37 37 weeks gestation of pregnancy: Secondary | ICD-10-CM | POA: Insufficient documentation

## 2016-02-23 DIAGNOSIS — R55 Syncope and collapse: Secondary | ICD-10-CM

## 2016-02-23 HISTORY — DX: Encounter for supervision of normal pregnancy, unspecified, unspecified trimester: Z34.90

## 2016-02-23 LAB — CBC WITH DIFFERENTIAL/PLATELET
BASOS ABS: 0 10*3/uL (ref 0.0–0.1)
BASOS PCT: 0 %
EOS ABS: 0.1 10*3/uL (ref 0.0–0.7)
Eosinophils Relative: 1 %
HEMATOCRIT: 30.8 % — AB (ref 36.0–46.0)
HEMOGLOBIN: 10.1 g/dL — AB (ref 12.0–15.0)
Lymphocytes Relative: 10 %
Lymphs Abs: 1.4 10*3/uL (ref 0.7–4.0)
MCH: 26.8 pg (ref 26.0–34.0)
MCHC: 32.8 g/dL (ref 30.0–36.0)
MCV: 81.7 fL (ref 78.0–100.0)
MONOS PCT: 5 %
Monocytes Absolute: 0.7 10*3/uL (ref 0.1–1.0)
NEUTROS ABS: 11.9 10*3/uL — AB (ref 1.7–7.7)
NEUTROS PCT: 84 %
Platelets: 254 10*3/uL (ref 150–400)
RBC: 3.77 MIL/uL — ABNORMAL LOW (ref 3.87–5.11)
RDW: 14.5 % (ref 11.5–15.5)
WBC: 14.1 10*3/uL — ABNORMAL HIGH (ref 4.0–10.5)

## 2016-02-23 LAB — COMPREHENSIVE METABOLIC PANEL
ALBUMIN: 2.7 g/dL — AB (ref 3.5–5.0)
ALK PHOS: 80 U/L (ref 38–126)
ALT: 15 U/L (ref 14–54)
ANION GAP: 6 (ref 5–15)
AST: 18 U/L (ref 15–41)
CO2: 22 mmol/L (ref 22–32)
Calcium: 9 mg/dL (ref 8.9–10.3)
Chloride: 107 mmol/L (ref 101–111)
Creatinine, Ser: 0.74 mg/dL (ref 0.44–1.00)
GFR calc Af Amer: >60 mL/min (ref >60)
GFR calc non Af Amer: >60 mL/min (ref >60)
GLUCOSE: 93 mg/dL (ref 65–99)
POTASSIUM: 3.5 mmol/L (ref 3.5–5.1)
SODIUM: 135 mmol/L (ref 135–145)
Total Bilirubin: 0.4 mg/dL (ref 0.3–1.2)
Total Protein: 5.8 g/dL — ABNORMAL LOW (ref 6.5–8.1)

## 2016-02-23 LAB — URINALYSIS, ROUTINE W REFLEX MICROSCOPIC
Bilirubin Urine: NEGATIVE
GLUCOSE, UA: NEGATIVE mg/dL
HGB URINE DIPSTICK: NEGATIVE
Ketones, ur: NEGATIVE mg/dL
LEUKOCYTES UA: NEGATIVE
Nitrite: NEGATIVE
PH: 7 (ref 5.0–8.0)
Protein, ur: NEGATIVE mg/dL
Specific Gravity, Urine: 1.008 (ref 1.005–1.030)

## 2016-02-23 MED ORDER — SODIUM CHLORIDE 0.9 % IV BOLUS (SEPSIS)
1000.0000 mL | Freq: Once | INTRAVENOUS | Status: AC
  Administered 2016-02-23: 1000 mL via INTRAVENOUS

## 2016-02-23 NOTE — ED Notes (Signed)
Pt ambulated without issue.

## 2016-02-23 NOTE — ED Notes (Signed)
Spoke with Corrie DandyMary RR OB RN. She will contact OB doctor and advise on monitoring if needed.

## 2016-02-23 NOTE — ED Provider Notes (Signed)
[facility]-EMERGENCY DEPT Provider Note   CSN: [id number] Arrival date & time: 02/23/16  1738     History   Chief Complaint Chief Complaint  Patient presents with  . Dizziness  . Palpitations    HPI   Blood pressure 127/75, pulse 115, temperature 98.7 F (37.1 C), temperature source Oral, resp. rate 20, last menstrual period 07/08/2015, SpO2 100 %, unknown if currently breastfeeding.  Tina [name] is a 25 y.o. female G2 P1, [redacted] weeks pregnant, uncomplicated pregnancy complaining of palpitations with a sensation that his heart is racing with associated feeling of lightheadedness like she is going to pass out ended. Of time while shopping. States that this has happened several times in her pregnancy and it is always when she is standing for an extended period of time. She denies any cough, chest pain, fever, hemoptysis, unusual leg swelling, calf pain, history of DVT/PE, she has been in her normal state of health, eating and drinking well with no nausea, vomiting change in bowel or bladder habits. She denies any abdominal pain, contractions, vaginal discharge including bleeding. She reports that her symptoms have spontaneously improved with no intervention side resting his afternoon.  Past Medical History:  Diagnosis Date  . Medical history non-contributory   . Pregnant     Patient Active Problem List   Diagnosis Date Noted  . Anemia affecting pregnancy in third trimester 01/28/2016  . Supervision of other normal pregnancy, antepartum 09/18/2015  . Personal history of previous postdates pregnancy 06/22/2014    Past Surgical History:  Procedure Laterality Date  . nasal cyst removal    . right arm surgery     Rod and screws placed    OB History    Gravida Para Term Preterm AB Living   3 1 1  0 1 1   SAB TAB Ectopic Multiple Live Births   0 0 0 0 1       Home Medications    Prior to Admission medications   Medication Sig Start Date End Date Taking? Authorizing Provider    ferrous sulfate (FERROUSUL) 325 (65 FE) MG tablet Take 1 tablet (325 mg total) by mouth 2 (two) times daily. 01/28/16   Hermina StaggersMichael L Ervin, MD  Prenatal Vit-Fe Phos-FA-Omega (VITAFOL GUMMIES) 3.33-0.333-34.8 MG CHEW Chew 3 tablets by mouth daily. 09/18/15   Roe Coombsachelle A Denney, CNM    Family History Family History  Problem Relation Age of Onset  . Cancer Mother   . Diabetes Father   . Hypertension Father     Social History Social History  Substance Use Topics  . Smoking status: Never Smoker  . Smokeless tobacco: Never Used  . Alcohol use No     Allergies   Patient has no known allergies.   Review of Systems Review of Systems  10 systems reviewed and found to be negative, except as noted in the HPI.  Physical Exam Updated Vital Signs BP 107/70   Pulse 95   Temp 98.7 F (37.1 C) (Oral)   Resp 24   LMP 07/08/2015 (LMP Unknown)   SpO2 100%   Physical Exam  Constitutional: She is oriented to person, place, and time. She appears well-developed and well-nourished. No distress.  HENT:  Head: Normocephalic and atraumatic.  Mouth/Throat: Oropharynx is clear and moist.  Eyes: Conjunctivae and EOM are normal. Pupils are equal, round, and reactive to light.  Neck: Normal range of motion. No JVD present. No tracheal deviation present.  Cardiovascular: Normal rate, regular rhythm and intact distal pulses.  Mild tachycardia, regular rate and rhythm  Pulmonary/Chest: Effort normal and breath sounds normal. No stridor. No respiratory distress. She has no wheezes. She has no rales. She exhibits no tenderness.  Abdominal: Soft. She exhibits no distension and no mass. There is no tenderness. There is no rebound and no guarding.  Gravid uterus  Musculoskeletal: Normal range of motion. She exhibits no edema or tenderness.  No calf asymmetry, superficial collaterals, palpable cords, edema, Homans sign negative bilaterally.    Neurological: She is alert and oriented to person, place, and  time.  Skin: Skin is warm. She is not diaphoretic.  Psychiatric: She has a normal mood and affect.  Nursing note and vitals reviewed.    ED Treatments / Results  Labs (all labs ordered are listed, but only abnormal results are displayed) Labs Reviewed  CBC WITH DIFFERENTIAL/PLATELET - Abnormal; Notable for the following:       Result Value   WBC 14.1 (*)    RBC 3.77 (*)    Hemoglobin 10.1 (*)    HCT 30.8 (*)    Neutro Abs 11.9 (*)    All other components within normal limits  COMPREHENSIVE METABOLIC PANEL - Abnormal; Notable for the following:    BUN <5 (*)    Total Protein 5.8 (*)    Albumin 2.7 (*)    All other components within normal limits  URINALYSIS, ROUTINE W REFLEX MICROSCOPIC (NOT AT [facility])    EKG  EKG Interpretation  Date/Time:  Sunday February 23 2016 17:51:40 EST Ventricular Rate:  114 PR Interval:  132 QRS Duration: 70 QT Interval:  316 QTC Calculation: 435 R Axis:   68 Text Interpretation:  Sinus tachycardia Otherwise normal ECG No previous ECGs available Confirmed by LITTLE MD, RACHEL ([id number]) on 02/23/2016 9:24:19 PM       Radiology No results found.  Procedures Procedures (including critical care time)  Medications Ordered in ED Medications  sodium chloride 0.9 % bolus 1,000 mL (0 mLs Intravenous Stopped 02/23/16 2005)  sodium chloride 0.9 % bolus 1,000 mL (0 mLs Intravenous Stopped 02/23/16 2143)     Initial Impression / Assessment and Plan / ED Course  I have reviewed the triage vital signs and the nursing notes.  Pertinent labs & imaging results that were available during my care of the patient were reviewed by me and considered in my medical decision making (see chart for details).  Clinical Course     Vitals:   02/23/16 2000 02/23/16 2045 02/23/16 2130 02/23/16 2145  BP: 112/67 108/65 106/63 107/70  Pulse: 92 93 87 95  Resp: 17 19 22 24   Temp:      TempSrc:      SpO2: 100% 100% 100% 100%    Medications  sodium chloride 0.9  % bolus 1,000 mL (0 mLs Intravenous Stopped 02/23/16 2005)  sodium chloride 0.9 % bolus 1,000 mL (0 mLs Intravenous Stopped 02/23/16 2143)    Tina [name] is 25 y.o. female presenting with Palpitations and feeling lightheaded, patient is [redacted] weeks pregnant, no complaints with respect to pregnancy. Initially she is tachycardic however, this is resolved after hydration. She states that her symptoms have resolved before prior to arrival.  Verbal report from vascular tach appreciated: Negative for bilateral DVT. Rapid response evaluation appreciated, patient was on NST for 30 minutes with no abnormalities, cleared by OB/GYN as per rapid response nurse.  In shared decision-making we have discussed the pros and cons of proceeding forward with a CTA to rule out PE,  given the reassuring physical exam, the responsiveness of her tachycardia to fluid bolus I personally would recommend not obtaining CTA given the risks and benefits, patient agrees and we've had an extensive discussion of return precautions. Will follow closely with OB/GYN and rest and stay well hydrated over the next few days.  Evaluation does not show pathology that would require ongoing emergent intervention or inpatient treatment. Pt is hemodynamically stable and mentating appropriately. Discussed findings and plan with patient/guardian, who agrees with care plan. All questions answered. Return precautions discussed and outpatient follow up given.      Final Clinical Impressions(s) / ED Diagnoses   Final diagnoses:  Near syncope    New Prescriptions Discharge Medication List as of 02/23/2016  9:25 PM       Wynetta Emeryicole Manal Kreutzer, PA-C 02/23/16 2227    Laurence Spatesachel Morgan Little, MD 02/25/16 2036

## 2016-02-23 NOTE — Progress Notes (Signed)
[facility]  A G3P1 at 32 6/[redacted] weeks gestation here with c/o dizziness and palpitations. Pt is presently in the vascular lab.

## 2016-02-23 NOTE — Progress Notes (Signed)
G3P2 @32wk6da , here with palpatations. Reactive NST, baseline heart rate 135bpm, multiple accelerations, no decelerations, no contractions. Notified Dr. Despina HiddenEure, OB cleared @ 2005

## 2016-02-23 NOTE — ED Notes (Addendum)
Rapid response OB RN will be coming shortly for patient evaluation

## 2016-02-23 NOTE — ED Notes (Signed)
Vascular tech is aware of patient 

## 2016-02-23 NOTE — ED Triage Notes (Addendum)
Patient complains of dizziness and palpitations that started earlier today, reports mostly resolved on arrival. States that she has good fetal movement. No cramping, no discharge. NAD. G2, P1

## 2016-02-23 NOTE — Progress Notes (Signed)
VASCULAR LAB PRELIMINARY  PRELIMINARY  PRELIMINARY  PRELIMINARY  Bilateral lower extremity venous duplex completed.    Preliminary report:  There is no obvious evidence of DVT or SVT noted in the bilateral lower extremities.   [facility]ed [facility] to [facility] Pisciotta, PA-C  Artesha Wemhoff, RVT 02/23/2016, 6:55 PM

## 2016-02-23 NOTE — Discharge Instructions (Signed)
Please follow with your primary care doctor in the next 2 days for a check-up. They must obtain records for further management.  ° °Do not hesitate to return to the Emergency Department for any new, worsening or concerning symptoms.  ° °

## 2016-02-23 NOTE — ED Notes (Signed)
Pt to vascular.

## 2016-02-27 ENCOUNTER — Ambulatory Visit (INDEPENDENT_AMBULATORY_CARE_PROVIDER_SITE_OTHER): Admitting: Certified Nurse Midwife

## 2016-02-27 VITALS — BP 108/73 | HR 93 | Wt 197.0 lb

## 2016-02-27 DIAGNOSIS — Z8759 Personal history of other complications of pregnancy, childbirth and the puerperium: Secondary | ICD-10-CM

## 2016-02-27 DIAGNOSIS — Z3480 Encounter for supervision of other normal pregnancy, unspecified trimester: Secondary | ICD-10-CM

## 2016-02-27 DIAGNOSIS — D649 Anemia, unspecified: Secondary | ICD-10-CM

## 2016-02-27 DIAGNOSIS — Z3482 Encounter for supervision of other normal pregnancy, second trimester: Secondary | ICD-10-CM

## 2016-02-27 DIAGNOSIS — O99013 Anemia complicating pregnancy, third trimester: Secondary | ICD-10-CM

## 2016-02-27 NOTE — Progress Notes (Signed)
Subjective:    Tina [name] is a 25 y.o. female being seen today for her obstetrical visit. She is at 548w3d gestation. Patient reports no complaints. Fetal movement: normal.  Problem List Items Addressed This Visit      Other   Personal history of previous postdates pregnancy   Supervision of other normal pregnancy, antepartum   Anemia affecting pregnancy in third trimester - Primary     Patient Active Problem List   Diagnosis Date Noted  . Anemia affecting pregnancy in third trimester 01/28/2016  . Supervision of other normal pregnancy, antepartum 09/18/2015  . Personal history of previous postdates pregnancy 06/22/2014   Objective:    BP 108/73   Pulse 93   Wt 197 lb (89.4 kg)   LMP 07/08/2015 (LMP Unknown)   BMI 34.90 kg/m  FHT:  144 BPM  Uterine Size: 33 cm and size equals dates  Presentation: cephalic     Assessment:    Pregnancy @ 468w3d weeks   Doing well  Plan:     labs reviewed, problem list updated Consent signed. GBS planning TDAP offered  Rhogam given for RH negative Pediatrician: discussed. Infant feeding: plans to breastfeed. Maternity leave: discussed. Cigarette smoking: never smoked. No orders of the defined types were placed in this encounter.  No orders of the defined types were placed in this encounter.  Follow up in 2 Weeks.

## 2016-03-02 ENCOUNTER — Encounter: Payer: Self-pay | Admitting: Obstetrics

## 2016-03-16 ENCOUNTER — Encounter: Payer: Self-pay | Admitting: Obstetrics

## 2016-03-16 ENCOUNTER — Ambulatory Visit (INDEPENDENT_AMBULATORY_CARE_PROVIDER_SITE_OTHER): Admitting: Obstetrics

## 2016-03-16 VITALS — BP 112/74 | HR 107 | Temp 98.8°F | Wt 205.0 lb

## 2016-03-16 DIAGNOSIS — Z3480 Encounter for supervision of other normal pregnancy, unspecified trimester: Secondary | ICD-10-CM

## 2016-03-16 DIAGNOSIS — Z3483 Encounter for supervision of other normal pregnancy, third trimester: Secondary | ICD-10-CM

## 2016-03-16 NOTE — Progress Notes (Signed)
Subjective:    Tina [name] is a 25 y.o. female being seen today for her obstetrical visit. She is at 676w0d gestation. Patient reports heartburn, mild. Fetal movement: normal.  Problem List Items Addressed This Visit    Supervision of other normal pregnancy, antepartum - Primary     Patient Active Problem List   Diagnosis Date Noted  . Anemia affecting pregnancy in third trimester 01/28/2016  . Supervision of other normal pregnancy, antepartum 09/18/2015  . Personal history of previous postdates pregnancy 06/22/2014   Objective:    BP 112/74   Pulse (!) 107   Temp 98.8 F (37.1 C)   Wt 205 lb (93 kg)   LMP 07/08/2015 (LMP Unknown)   BMI 36.31 kg/m  FHT:  140 BPM  Uterine Size: size equals dates  Presentation: cephalic     Assessment:    Pregnancy @ 526w0d weeks   Plan:     labs reviewed, problem list updated Consent signed. GBS sent TDAP offered  Rhogam given for RH negative Pediatrician: discussed. Infant feeding: plans to breastfeed. Maternity leave: discussed. Cigarette smoking: never smoked. No orders of the defined types were placed in this encounter.  No orders of the defined types were placed in this encounter.  Follow up in 1 Week.

## 2016-03-17 LAB — OB RESULTS CONSOLE GBS: STREP GROUP B AG: POSITIVE

## 2016-03-18 LAB — STREP GP B NAA: STREP GROUP B AG: POSITIVE — AB

## 2016-03-25 ENCOUNTER — Encounter: Payer: Self-pay | Admitting: Obstetrics

## 2016-03-25 ENCOUNTER — Ambulatory Visit (INDEPENDENT_AMBULATORY_CARE_PROVIDER_SITE_OTHER): Admitting: Obstetrics

## 2016-03-25 DIAGNOSIS — Z22330 Carrier of Group B streptococcus: Secondary | ICD-10-CM | POA: Insufficient documentation

## 2016-03-25 DIAGNOSIS — Z3480 Encounter for supervision of other normal pregnancy, unspecified trimester: Secondary | ICD-10-CM

## 2016-03-25 DIAGNOSIS — Z3483 Encounter for supervision of other normal pregnancy, third trimester: Secondary | ICD-10-CM

## 2016-03-25 NOTE — Progress Notes (Signed)
Pt c/o L lower pelvic pain x 1 week.

## 2016-03-25 NOTE — Progress Notes (Signed)
Subjective:    Tina ForestCharlise [name] is a 25 y.o. female being seen today for her obstetrical visit. She is at 1620w2d gestation. Patient reports no complaints. Fetal movement: normal.  Problem List Items Addressed This Visit    GBS carrier   Supervision of other normal pregnancy, antepartum     Patient Active Problem List   Diagnosis Date Noted  . 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 Anemia affecting pregnancy in third trimester 01/28/2016  . Supervision of other normal pregnancy, antepartum 09/18/2015  . Personal history of previous postdates pregnancy 06/22/2014    Objective:    BP 118/70   Pulse 95   Wt 200 lb (90.7 kg)   LMP 07/08/2015 (LMP Unknown)   BMI 35.43 kg/m  FHT: 150 BPM  Uterine Size: size equals dates  Presentations: unsure    Assessment:    Pregnancy @ 3720w2d weeks   Plan:   Plans for delivery: Vaginal anticipated; labs reviewed; problem list updated Counseling: Consent signed. Infant feeding: plans to breastfeed. Cigarette smoking: never smoked. L&D discussion: symptoms of labor, discussed when to call, discussed what number to call, anesthetic/analgesic options reviewed and delivering clinician:  plans no preference. Postpartum supports and preparation: circumcision discussed and contraception plans discussed.  Follow up in 1 Week.

## 2016-03-30 NOTE — L&D Delivery Note (Signed)
26 y.o. [id number] at 4868w0d delivered a viable female infant in cephalic,  Occiput posterior position. There was no nuchal cord. The anterior shoulder delivered with ease. A 60 sec delayed cord clamping was performed. Cord clamped x2 and cut. Placenta delivered spontaneously intact, with 3VC. Fundus firm on exam with massage and pitocin. Good hemostasis noted.  Anesthesia: Epidural Laceration: 1st degree Suture: 3-0 Vicryl Good hemostasis noted. EBL: 150 cc  Mom and baby recovering in LDR.    Apgars: APGAR (1 MIN): 9   APGAR (5 MINS): 9    Weight: Pending skin to skin    Gorden HarmsMegan Campbell, MD PGY-2 04/20/2016, 3:11 PM   OB FELLOW DELIVERY ATTESTATION  I was gloved and present for the delivery in its entirety, and I agree with the above resident's note.    Jen MowElizabeth Tyah Acord, DO OB Fellow 3:56 PM

## 2016-04-01 ENCOUNTER — Ambulatory Visit (INDEPENDENT_AMBULATORY_CARE_PROVIDER_SITE_OTHER): Admitting: Obstetrics

## 2016-04-01 VITALS — BP 122/74 | HR 116 | Wt 200.0 lb

## 2016-04-01 DIAGNOSIS — Z3480 Encounter for supervision of other normal pregnancy, unspecified trimester: Secondary | ICD-10-CM

## 2016-04-01 DIAGNOSIS — Z3483 Encounter for supervision of other normal pregnancy, third trimester: Secondary | ICD-10-CM

## 2016-04-01 NOTE — Progress Notes (Signed)
No concerns today per pt.  

## 2016-04-01 NOTE — Progress Notes (Signed)
Subjective:    Tina [name] is a 26 y.o. female being seen today for her obstetrical visit. She is at 9245w2d gestation. Patient reports no complaints. Fetal movement: normal.  Problem List Items Addressed This Visit    None     Patient Active Problem List   Diagnosis Date Noted  . 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 Anemia affecting pregnancy in third trimester 01/28/2016  . Supervision of other normal pregnancy, antepartum 09/18/2015  . Personal history of previous postdates pregnancy 06/22/2014    Objective:    BP 122/74   Pulse (!) 116   Wt 200 lb (90.7 kg)   LMP 07/08/2015 (LMP Unknown)   BMI 35.43 kg/m  FHT: 150 BPM  Uterine Size: size equals dates    Assessment:    Pregnancy @ 545w2d weeks   Plan:   Plans for delivery: Vaginal anticipated; labs reviewed; problem list updated Counseling: Consent signed. Infant feeding: plans to breastfeed. Cigarette smoking: never smoked. L&D discussion: symptoms of labor, discussed when to call, discussed what number to call, anesthetic/analgesic options reviewed and delivering clinician:  plans no preference. Postpartum supports and preparation: circumcision discussed and contraception plans discussed.  Follow up in 1 Week.  Patient ID: Tina [name], female   DOB: 04/17/1990, 26 y.o.   MRN: [id number]

## 2016-04-08 ENCOUNTER — Encounter: Admitting: Obstetrics

## 2016-04-09 ENCOUNTER — Ambulatory Visit (INDEPENDENT_AMBULATORY_CARE_PROVIDER_SITE_OTHER): Admitting: Certified Nurse Midwife

## 2016-04-09 VITALS — BP 123/73 | HR 103 | Wt 200.0 lb

## 2016-04-09 DIAGNOSIS — B373 Candidiasis of vulva and vagina: Secondary | ICD-10-CM

## 2016-04-09 DIAGNOSIS — Z3480 Encounter for supervision of other normal pregnancy, unspecified trimester: Secondary | ICD-10-CM

## 2016-04-09 DIAGNOSIS — Z8759 Personal history of other complications of pregnancy, childbirth and the puerperium: Secondary | ICD-10-CM

## 2016-04-09 DIAGNOSIS — Z3483 Encounter for supervision of other normal pregnancy, third trimester: Secondary | ICD-10-CM

## 2016-04-09 DIAGNOSIS — Z22330 Carrier of Group B streptococcus: Secondary | ICD-10-CM

## 2016-04-09 DIAGNOSIS — O99013 Anemia complicating pregnancy, third trimester: Secondary | ICD-10-CM

## 2016-04-09 DIAGNOSIS — B3731 Acute candidiasis of vulva and vagina: Secondary | ICD-10-CM

## 2016-04-09 MED ORDER — FLUCONAZOLE 150 MG PO TABS: 150.0000 mg | ORAL_TABLET | Freq: Once | ORAL | 0 refills | Status: AC

## 2016-04-09 NOTE — Patient Instructions (Addendum)

## 2016-04-09 NOTE — Progress Notes (Signed)
   PRENATAL VISIT NOTE  Subjective:  Tina [name] is a 26 y.o. G3P1011 at 5116w3d being seen today for ongoing prenatal care.  She is currently monitored for the following issues for this low-risk pregnancy and has Personal history of previous postdates pregnancy; Supervision of other normal pregnancy, antepartum; Anemia affecting pregnancy in third trimester; and GBS carrier on her problem list.  Patient reports no complaints.  Contractions: Not present. Vag. Bleeding: None.  Movement: Present. Denies leaking of fluid.   The following portions of the patient's history were reviewed and updated as appropriate: allergies, current medications, past family history, past medical history, past social history, past surgical history and problem list. Problem list updated.  Objective:   Vitals:   04/09/16 1354  BP: 123/73  Pulse: (!) 103  Weight: 200 lb (90.7 kg)    Fetal Status: Fetal Heart Rate (bpm): 145 Fundal Height: 39 cm Movement: Present  Presentation: Vertex  General:  Alert, oriented and cooperative. Patient is in no acute distress.  Skin: Skin is warm and dry. No rash noted.   Cardiovascular: Normal heart rate noted  Respiratory: Normal respiratory effort, no problems with respiration noted  Abdomen: Soft, gravid, appropriate for gestational age. Pain/Pressure: Present     Pelvic:  Cervical exam performed Dilation: 1 Effacement (%): 0 Station: Ballotable  + white, chunky vaginal discharge noted.   Extremities: Normal range of motion.  Edema: Trace  Mental Status: Normal mood and affect. Normal behavior. Normal judgment and thought content.   Assessment and Plan:  Pregnancy: G3P1011 at 2516w3d  1. Personal history of previous postdates pregnancy     Induction scheduled.   2. Supervision of other normal pregnancy, antepartum      Doing well       Vaginal yeast infection noted on exam: Diflucan sent.   3. Anemia affecting pregnancy in third trimester     CBC: 02/23/16; 10.6;  taking FeSOL.    4. GBS carrier    PCN in labor  Term labor symptoms and general obstetric precautions including but not limited to vaginal bleeding, contractions, leaking of fluid and fetal movement were reviewed in detail with the patient. Please refer to After Visit Summary for other counseling recommendations.  Return in about 1 week (around 04/16/2016) for ROB, NST.   Roe Coombsachelle A Alecxander Mainwaring, CNM

## 2016-04-09 NOTE — Progress Notes (Signed)
preg

## 2016-04-13 ENCOUNTER — Encounter (HOSPITAL_COMMUNITY): Payer: Self-pay

## 2016-04-13 ENCOUNTER — Inpatient Hospital Stay (HOSPITAL_COMMUNITY)
Admission: AD | Admit: 2016-04-13 | Discharge: 2016-04-13 | Disposition: A | Discharge status: Home / Self Care | Source: Ambulatory Visit | Attending: Family Medicine | Admitting: Family Medicine

## 2016-04-13 DIAGNOSIS — Z3483 Encounter for supervision of other normal pregnancy, third trimester: Secondary | ICD-10-CM | POA: Diagnosis present

## 2016-04-13 LAB — POCT FERN TEST: POCT Fern Test: NEGATIVE

## 2016-04-13 NOTE — Discharge Instructions (Signed)
Third Trimester of Pregnancy °The third trimester is from week 29 through week 40 (months 7 through 9). The third trimester is a time when the unborn baby (fetus) is growing rapidly. At the end of the ninth month, the fetus is about 20 inches in length and weighs 6-10 pounds. °Body changes during your third trimester °Your body goes through many changes during pregnancy. The changes vary from woman to woman. During the third trimester: °· Your weight will continue to increase. You can expect to gain 25-35 pounds (11-16 kg) by the end of the pregnancy. °· You may begin to get stretch marks on your hips, abdomen, and breasts. °· You may urinate more often because the fetus is moving lower into your pelvis and pressing on your bladder. °· You may develop or continue to have heartburn. This is caused by increased hormones that slow down muscles in the digestive tract. °· You may develop or continue to have constipation because increased hormones slow digestion and cause the muscles that push waste through your intestines to relax. °· You may develop hemorrhoids. These are swollen veins (varicose veins) in the rectum that can itch or be painful. °· You may develop swollen, bulging veins (varicose veins) in your legs. °· You may have increased body aches in the pelvis, back, or thighs. This is due to weight gain and increased hormones that are relaxing your joints. °· You may have changes in your hair. These can include thickening of your hair, rapid growth, and changes in texture. Some women also have hair loss during or after pregnancy, or hair that feels dry or thin. Your hair will most likely return to normal after your baby is born. °· Your breasts will continue to grow and they will continue to become tender. A yellow fluid (colostrum) may leak from your breasts. This is the first milk you are producing for your baby. °· Your belly button may stick out. °· You may notice more swelling in your hands, face, or  ankles. °· You may have increased tingling or numbness in your hands, arms, and legs. The skin on your belly may also feel numb. °· You may feel short of breath because of your expanding uterus. °· You may have more problems sleeping. This can be caused by the size of your belly, increased need to urinate, and an increase in your body's metabolism. °· You may notice the fetus "dropping," or moving lower in your abdomen. °· You may have increased vaginal discharge. °· Your cervix becomes thin and soft (effaced) near your due date. °What to expect at prenatal visits °You will have prenatal exams every 2 weeks until week 36. Then you will have weekly prenatal exams. During a routine prenatal visit: °· You will be weighed to make sure you and the fetus are growing normally. °· Your blood pressure will be taken. °· Your abdomen will be measured to track your baby's growth. °· The fetal heartbeat will be listened to. °· Any test results from the previous visit will be discussed. °· You may have a cervical check near your due date to see if you have effaced. °At around 36 weeks, your health care provider will check your cervix. At the same time, your health care provider will also perform a test on the secretions of the vaginal tissue. This test is to determine if a type of bacteria, Group B streptococcus, is present. Your health care provider will explain this further. °Your health care provider may ask you: °·   What your birth plan is. °· How you are feeling. °· If you are feeling the baby move. °· If you have had any abnormal symptoms, such as leaking fluid, bleeding, severe headaches, or abdominal cramping. °· If you are using any tobacco products, including cigarettes, chewing tobacco, and electronic cigarettes. °· If you have any questions. °Other tests or screenings that may be performed during your third trimester include: °· Blood tests that check for low iron levels (anemia). °· Fetal testing to check the health,  activity level, and growth of the fetus. Testing is done if you have certain medical conditions or if there are problems during the pregnancy. °· Nonstress test (NST). This test checks the health of your baby to make sure there are no signs of problems, such as the baby not getting enough oxygen. During this test, a belt is placed around your belly. The baby is made to move, and its heart rate is monitored during movement. °What is false labor? °False labor is a condition in which you feel small, irregular tightenings of the muscles in the womb (contractions) that eventually go away. These are called Braxton Hicks contractions. Contractions may last for hours, days, or even weeks before true labor sets in. If contractions come at regular intervals, become more frequent, increase in intensity, or become painful, you should see your health care provider. °What are the signs of labor? °· Abdominal cramps. °· Regular contractions that start at 10 minutes apart and become stronger and more frequent with time. °· Contractions that start on the top of the uterus and spread down to the lower abdomen and back. °· Increased pelvic pressure and dull back pain. °· A watery or bloody mucus discharge that comes from the vagina. °· Leaking of amniotic fluid. This is also known as your "water breaking." It could be a slow trickle or a gush. Let your doctor know if it has a color or strange odor. °If you have any of these signs, call your health care provider right away, even if it is before your due date. °Follow these instructions at home: °Eating and drinking °· Continue to eat regular, healthy meals. °· Do not eat: °¨ Raw meat or meat spreads. °¨ Unpasteurized milk or cheese. °¨ Unpasteurized juice. °¨ Store-made salad. °¨ Refrigerated smoked seafood. °¨ Hot dogs or deli meat, unless they are piping hot. °¨ More than 6 ounces of albacore tuna a week. °¨ Shark, swordfish, king mackerel, or tile fish. °¨ Store-made salads. °¨ Raw  sprouts, such as mung bean or alfalfa sprouts. °· Take prenatal vitamins as told by your health care provider. °· Take 1000 mg of calcium daily as told by your health care provider. °· If you develop constipation: °¨ Take over-the-counter or prescription medicines. °¨ Drink enough fluid to keep your urine clear or pale yellow. °¨ Eat foods that are high in fiber, such as fresh fruits and vegetables, whole grains, and beans. °¨ Limit foods that are high in fat and processed sugars, such as fried and sweet foods. °Activity °· Exercise only as directed by your health care provider. Healthy pregnant women should aim for 2 hours and 30 minutes of moderate exercise per week. If you experience any pain or discomfort while exercising, stop. °· Avoid heavy lifting. °· Do not exercise in extreme heat or humidity, or at high altitudes. °· Wear low-heel, comfortable shoes. °· Practice good posture. °· Do not travel far distances unless it is absolutely necessary and only with the approval   of your health care provider. °· Wear your seat belt at all times while in a car, on a bus, or on a plane. °· Take frequent breaks and rest with your legs elevated if you have leg cramps or low back pain. °· Do not use hot tubs, steam rooms, or saunas. °· You may continue to have sex unless your health care provider tells you otherwise. °Lifestyle °· Do not use any products that contain nicotine or tobacco, such as cigarettes and e-cigarettes. If you need help quitting, ask your health care provider. °· Do not drink alcohol. °· Do not use any medicinal herbs or unprescribed drugs. These chemicals affect the formation and growth of the baby. °· If you develop varicose veins: °¨ Wear support pantyhose or compression stockings as told by your healthcare provider. °¨ Elevate your feet for 15 minutes, 3-4 times a day. °· Wear a supportive maternity bra to help with breast tenderness. °General instructions °· Take over-the-counter and prescription  medicines only as told by your health care provider. There are medicines that are either safe or unsafe to take during pregnancy. °· Take warm sitz baths to soothe any pain or discomfort caused by hemorrhoids. Use hemorrhoid cream or witch hazel if your health care provider approves. °· Avoid cat litter boxes and soil used by cats. These carry germs that can cause birth defects in the baby. If you have a cat, ask someone to clean the litter box for you. °· To prepare for the arrival of your baby: °¨ Take prenatal classes to understand, practice, and ask questions about the labor and delivery. °¨ Make a trial run to the hospital. °¨ Visit the hospital and tour the maternity area. °¨ Arrange for maternity or paternity leave through employers. °¨ Arrange for family and friends to take care of pets while you are in the hospital. °¨ Purchase a rear-facing car seat and make sure you know how to install it in your car. °¨ Pack your hospital bag. °¨ Prepare the baby’s nursery. Make sure to remove all pillows and stuffed animals from the baby's crib to prevent suffocation. °· Visit your dentist if you have not gone during your pregnancy. Use a soft toothbrush to brush your teeth and be gentle when you floss. °· Keep all prenatal follow-up visits as told by your health care provider. This is important. °Contact a health care provider if: °· You are unsure if you are in labor or if your water has broken. °· You become dizzy. °· You have mild pelvic cramps, pelvic pressure, or nagging pain in your abdominal area. °· You have lower back pain. °· You have persistent nausea, vomiting, or diarrhea. °· You have an unusual or bad smelling vaginal discharge. °· You have pain when you urinate. °Get help right away if: °· You have a fever. °· You are leaking fluid from your vagina. °· You have spotting or bleeding from your vagina. °· You have severe abdominal pain or cramping. °· You have rapid weight loss or weight gain. °· You have  shortness of breath with chest pain. °· You notice sudden or extreme swelling of your face, hands, ankles, feet, or legs. °· Your baby makes fewer than 10 movements in 2 hours. °· You have severe headaches that do not go away with medicine. °· You have vision changes. °Summary °· The third trimester is from week 29 through week 40, months 7 through 9. The third trimester is a time when the unborn baby (fetus)   is growing rapidly. °· During the third trimester, your discomfort may increase as you and your baby continue to gain weight. You may have abdominal, leg, and back pain, sleeping problems, and an increased need to urinate. °· During the third trimester your breasts will keep growing and they will continue to become tender. A yellow fluid (colostrum) may leak from your breasts. This is the first milk you are producing for your baby. °· False labor is a condition in which you feel small, irregular tightenings of the muscles in the womb (contractions) that eventually go away. These are called Braxton Hicks contractions. Contractions may last for hours, days, or even weeks before true labor sets in. °· Signs of labor can include: abdominal cramps; regular contractions that start at 10 minutes apart and become stronger and more frequent with time; watery or bloody mucus discharge that comes from the vagina; increased pelvic pressure and dull back pain; and leaking of amniotic fluid. °This information is not intended to replace advice given to you by your health care provider. Make sure you discuss any questions you have with your health care provider. °Document Released: 03/10/2001 Document Revised: 08/22/2015 Document Reviewed: 05/17/2012 °[employer]vier Interactive Patient Education © 2017 [employer] °Introduction °Patient Name: ________________________________________________ Patient Due Date: ____________________ °What is a fetal movement count? °A fetal movement count is the number of times that you feel your baby  move during a certain amount of time. This may also be called a fetal kick count. A fetal movement count is recommended for every pregnant woman. You may be asked to start counting fetal movements as early as week 28 of your pregnancy. °Pay attention to when your baby is most active. You may notice your baby's sleep and wake cycles. You may also notice things that make your baby move more. You should do a fetal movement count: °· When your baby is normally most active. °· At the same time each day. °A good time to count movements is while you are resting, after having something to eat and drink. °How do I count fetal movements? °1. Find a quiet, comfortable area. Sit, or lie down on your side. °2. Write down the date, the start time and stop time, and the number of movements that you felt between those two times. Take this information with you to your health care visits. °3. For 2 hours, count kicks, flutters, swishes, rolls, and jabs. You should feel at least 10 movements during 2 hours. °4. You may stop counting after you have felt 10 movements. °5. If you do not feel 10 movements in 2 hours, have something to eat and drink. Then, keep resting and counting for 1 hour. If you feel at least 4 movements during that hour, you may stop counting. °Contact a health care provider if: °· You feel fewer than 4 movements in 2 hours. °· Your baby is not moving like he or she usually does. °Date: ____________ Start time: ____________ Stop time: ____________ Movements: ____________ °Date: ____________ Start time: ____________ Stop time: ____________ Movements: ____________ °Date: ____________ Start time: ____________ Stop time: ____________ Movements: ____________ °Date: ____________ Start time: ____________ Stop time: ____________ Movements: ____________ °Date: ____________ Start time: ____________ Stop time: ____________ Movements: ____________ °Date: ____________ Start time: ____________ Stop time: ____________ Movements:  ____________ °Date: ____________ Start time: ____________ Stop time: ____________ Movements: ____________ °Date: ____________ Start time: ____________ Stop time: ____________ Movements: ____________ °Date: ____________ Start time: ____________ Stop time: ____________ Movements: ____________ °This information is not intended to replace   advice given to you by your health care provider. Make sure you discuss any questions you have with your health care provider. °Document Released: 04/15/2006 Document Revised: 11/13/2015 Document Reviewed: 04/25/2015 °[employer] Interactive Patient Education © 2017 [employer] °Braxton Hicks Contractions °Contractions of the uterus can occur throughout pregnancy. Contractions are not always a sign that you are in labor.  °WHAT ARE BRAXTON HICKS CONTRACTIONS?  °Contractions that occur before labor are called Braxton Hicks contractions, or false labor. Toward the end of pregnancy (32-34 weeks), these contractions can develop more often and may become more forceful. This is not true labor because these contractions do not result in opening (dilatation) and thinning of the cervix. They are sometimes difficult to tell apart from true labor because these contractions can be forceful and people have different pain tolerances. You should not feel embarrassed if you go to the hospital with false labor. Sometimes, the only way to tell if you are in true labor is for your health care provider to look for changes in the cervix. °If there are no prenatal problems or other health problems associated with the pregnancy, it is completely safe to be sent home with false labor and await the onset of true labor. °HOW CAN YOU TELL THE DIFFERENCE BETWEEN TRUE AND FALSE LABOR? °False Labor  °· The contractions of false labor are usually shorter and not as hard as those of true labor.   °· The contractions are usually irregular.   °· The contractions are often felt in the front of the lower abdomen and in  the groin.   °· The contractions may go away when you walk around or change positions while lying down.   °· The contractions get weaker and are shorter lasting as time goes on.   °· The contractions do not usually become progressively stronger, regular, and closer together as with true labor.   °True Labor  °· Contractions in true labor last 30-70 seconds, become very regular, usually become more intense, and increase in frequency.   °· The contractions do not go away with walking.   °· The discomfort is usually felt in the top of the uterus and spreads to the lower abdomen and low back.   °· True labor can be determined by your health care provider with an exam. This will show that the cervix is dilating and getting thinner.   °WHAT TO REMEMBER °· Keep up with your usual exercises and follow other instructions given by your health care provider.   °· Take medicines as directed by your health care provider.   °· Keep your regular prenatal appointments.   °· Eat and drink lightly if you think you are going into labor.   °· If Braxton Hicks contractions are making you uncomfortable:   °¨ Change your position from lying down or resting to walking, or from walking to resting.   °¨ Sit and rest in a tub of warm water.   °¨ Drink 2-3 glasses of water. Dehydration may cause these contractions.   °¨ Do slow and deep breathing several times an hour.   °WHEN SHOULD I SEEK IMMEDIATE MEDICAL CARE? °Seek immediate medical care if: °· Your contractions become stronger, more regular, and closer together.   °· You have fluid leaking or gushing from your vagina.   °· You have a fever.   °· You pass blood-tinged mucus.   °· You have vaginal bleeding.   °· You have continuous abdominal pain.   °· You have low back pain that you never had before.   °· You feel your baby's head pushing down and causing pelvic pressure.   °· Your   baby is not moving as much as it used to.   °This information is not intended to replace advice given to you  by your health care provider. Make sure you discuss any questions you have with your health care provider. °Document Released: 03/16/2005 Document Revised: 07/08/2015 Document Reviewed: 12/26/2012 °Elsevier Interactive Patient Education © 2017 [employer] ° °

## 2016-04-13 NOTE — Progress Notes (Signed)
Notified of pt arrival in MAU and complaint with negative fern, no pain, and reactive NST. Ok to discharge home

## 2016-04-13 NOTE — MAU Note (Signed)
Pt reports ? ROM at 1820, denies contractions.

## 2016-04-16 ENCOUNTER — Ambulatory Visit (INDEPENDENT_AMBULATORY_CARE_PROVIDER_SITE_OTHER): Admitting: Obstetrics

## 2016-04-16 ENCOUNTER — Encounter: Payer: Self-pay | Admitting: Obstetrics

## 2016-04-16 VITALS — BP 145/65 | HR 114 | Wt 199.0 lb

## 2016-04-16 DIAGNOSIS — Z3480 Encounter for supervision of other normal pregnancy, unspecified trimester: Secondary | ICD-10-CM

## 2016-04-16 DIAGNOSIS — Z3483 Encounter for supervision of other normal pregnancy, third trimester: Secondary | ICD-10-CM

## 2016-04-16 NOTE — Progress Notes (Signed)
Subjective:    Tina [name] is a 26 y.o. female being seen today for her obstetrical visit. She is at 5911w3d gestation. Patient reports occasional contractions. Fetal movement: normal.  Problem List Items Addressed This Visit    None     Patient Active Problem List   Diagnosis Date Noted  . 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 Anemia affecting pregnancy in third trimester 01/28/2016  . Supervision of other normal pregnancy, antepartum 09/18/2015  . Personal history of previous postdates pregnancy 06/22/2014    Objective:    BP (!) 145/65   Pulse (!) 114   Wt 199 lb (90.3 kg)   LMP 07/08/2015 (LMP Unknown)   BMI 35.25 kg/m  FHT:  150 BPM  Uterine Size: size equals dates  Presentation: cephalic  Pelvic Exam:              Dilation: 2cm       Effacement: 50%   Station:  -3     Consistency: soft            Position: posterior     Assessment:    Pregnancy @ 3811w3d  weeks   Plan:    Postdates management: discussed fetal surveillance and induction, discussed fetal movement, NST reactive, biophysical profile ordered. Induction: scheduled for 1-22-208, written information given.  Follow up in postpartum.  Patient ID: Tina [name], female   DOB: [birth year], 26 y.o.   MRN: [id number]

## 2016-04-17 ENCOUNTER — Telehealth (HOSPITAL_COMMUNITY): Payer: Self-pay | Admitting: *Deleted

## 2016-04-17 ENCOUNTER — Encounter (HOSPITAL_COMMUNITY): Payer: Self-pay | Admitting: *Deleted

## 2016-04-17 NOTE — Telephone Encounter (Signed)
Preadmission screen  

## 2016-04-20 ENCOUNTER — Inpatient Hospital Stay (HOSPITAL_COMMUNITY): Admitting: Anesthesiology

## 2016-04-20 ENCOUNTER — Inpatient Hospital Stay (HOSPITAL_COMMUNITY)
Admission: RE | Admit: 2016-04-20 | Discharge: 2016-04-22 | DRG: 775 | Disposition: A | Discharge status: Home / Self Care | Source: Ambulatory Visit | Attending: Obstetrics & Gynecology | Admitting: Obstetrics & Gynecology

## 2016-04-20 ENCOUNTER — Encounter (HOSPITAL_COMMUNITY): Payer: Self-pay

## 2016-04-20 DIAGNOSIS — Z3A41 41 weeks gestation of pregnancy: Secondary | ICD-10-CM | POA: Diagnosis not present

## 2016-04-20 DIAGNOSIS — O9902 Anemia complicating childbirth: Secondary | ICD-10-CM | POA: Diagnosis present

## 2016-04-20 DIAGNOSIS — Z3490 Encounter for supervision of normal pregnancy, unspecified, unspecified trimester: Secondary | ICD-10-CM

## 2016-04-20 DIAGNOSIS — D649 Anemia, unspecified: Secondary | ICD-10-CM | POA: Diagnosis present

## 2016-04-20 DIAGNOSIS — O480 Post-term pregnancy: Secondary | ICD-10-CM | POA: Diagnosis present

## 2016-04-20 DIAGNOSIS — Z8249 Family history of ischemic heart disease and other diseases of the circulatory system: Secondary | ICD-10-CM

## 2016-04-20 DIAGNOSIS — O99824 Streptococcus B carrier state complicating childbirth: Secondary | ICD-10-CM | POA: Diagnosis present

## 2016-04-20 DIAGNOSIS — Z833 Family history of diabetes mellitus: Secondary | ICD-10-CM | POA: Diagnosis not present

## 2016-04-20 LAB — CBC
HEMATOCRIT: 31.2 % — AB (ref 36.0–46.0)
HEMOGLOBIN: 10 g/dL — AB (ref 12.0–15.0)
MCH: 24 pg — ABNORMAL LOW (ref 26.0–34.0)
MCHC: 32.1 g/dL (ref 30.0–36.0)
MCV: 75 fL — AB (ref 78.0–100.0)
Platelets: 227 10*3/uL (ref 150–400)
RBC: 4.16 MIL/uL (ref 3.87–5.11)
RDW: 15.8 % — AB (ref 11.5–15.5)
WBC: 15.2 10*3/uL — ABNORMAL HIGH (ref 4.0–10.5)

## 2016-04-20 LAB — TYPE AND SCREEN
ABO/RH(D): O POS
ANTIBODY SCREEN: NEGATIVE

## 2016-04-20 LAB — RPR: RPR Ser Ql: NONREACTIVE

## 2016-04-20 MED ORDER — PHENYLEPHRINE 40 MCG/ML (10ML) SYRINGE FOR IV PUSH (FOR BLOOD PRESSURE SUPPORT)
80.0000 ug | PREFILLED_SYRINGE | INTRAVENOUS | Status: DC | PRN
  Filled 2016-04-20: qty 5

## 2016-04-20 MED ORDER — ONDANSETRON HCL 4 MG/2ML IJ SOLN: 4.0000 mg | INTRAMUSCULAR | Status: DC | PRN

## 2016-04-20 MED ORDER — PENICILLIN G POTASSIUM 5000000 UNITS IJ SOLR
5.0000 10*6.[IU] | Freq: Once | INTRAVENOUS | Status: AC
  Administered 2016-04-20: 5 10*6.[IU] via INTRAVENOUS
  Filled 2016-04-20: qty 5

## 2016-04-20 MED ORDER — ONDANSETRON HCL 4 MG PO TABS: 4.0000 mg | ORAL_TABLET | ORAL | Status: DC | PRN

## 2016-04-20 MED ORDER — DIPHENHYDRAMINE HCL 50 MG/ML IJ SOLN: 12.5000 mg | INTRAMUSCULAR | Status: DC | PRN

## 2016-04-20 MED ORDER — BENZOCAINE-MENTHOL 20-0.5 % EX AERO
1.0000 "application " | INHALATION_SPRAY | CUTANEOUS | Status: DC | PRN
  Administered 2016-04-20: 1 via TOPICAL
  Filled 2016-04-20: qty 56

## 2016-04-20 MED ORDER — FENTANYL 2.5 MCG/ML BUPIVACAINE 1/10 % EPIDURAL INFUSION (WH - ANES)
14.0000 mL/h | INTRAMUSCULAR | Status: DC | PRN
  Administered 2016-04-20: 14 mL/h via EPIDURAL
  Filled 2016-04-20: qty 100

## 2016-04-20 MED ORDER — OXYTOCIN 40 UNITS IN LACTATED RINGERS INFUSION - SIMPLE MED
1.0000 m[IU]/min | INTRAVENOUS | Status: DC
  Administered 2016-04-20: 2 m[IU]/min via INTRAVENOUS
  Filled 2016-04-20: qty 1000

## 2016-04-20 MED ORDER — LACTATED RINGERS IV SOLN: 500.0000 mL | INTRAVENOUS | Status: DC | PRN

## 2016-04-20 MED ORDER — BUPIVACAINE HCL (PF) 0.25 % IJ SOLN
INTRAMUSCULAR | Status: DC | PRN
  Administered 2016-04-20: 3 mL via EPIDURAL
  Administered 2016-04-20: 4 mL via EPIDURAL

## 2016-04-20 MED ORDER — EPHEDRINE 5 MG/ML INJ
10.0000 mg | INTRAVENOUS | Status: DC | PRN
  Filled 2016-04-20: qty 4

## 2016-04-20 MED ORDER — TETANUS-DIPHTH-ACELL PERTUSSIS 5-2.5-18.5 LF-MCG/0.5 IM SUSP
0.5000 mL | Freq: Once | INTRAMUSCULAR | Status: AC
  Administered 2016-04-20: 0.5 mL via INTRAMUSCULAR
  Filled 2016-04-20: qty 0.5

## 2016-04-20 MED ORDER — SENNOSIDES-DOCUSATE SODIUM 8.6-50 MG PO TABS
2.0000 | ORAL_TABLET | ORAL | Status: DC
  Administered 2016-04-20 – 2016-04-21 (×2): 2 via ORAL
  Filled 2016-04-20 (×2): qty 2

## 2016-04-20 MED ORDER — COCONUT OIL OIL: 1.0000 "application " | TOPICAL_OIL | Status: DC | PRN

## 2016-04-20 MED ORDER — DIPHENHYDRAMINE HCL 25 MG PO CAPS: 25.0000 mg | ORAL_CAPSULE | Freq: Four times a day (QID) | ORAL | Status: DC | PRN

## 2016-04-20 MED ORDER — ACETAMINOPHEN 325 MG PO TABS: 650.0000 mg | ORAL_TABLET | ORAL | Status: DC | PRN

## 2016-04-20 MED ORDER — LACTATED RINGERS IV SOLN: 500.0000 mL | Freq: Once | INTRAVENOUS | Status: DC

## 2016-04-20 MED ORDER — ZOLPIDEM TARTRATE 5 MG PO TABS: 5.0000 mg | ORAL_TABLET | Freq: Every evening | ORAL | Status: DC | PRN

## 2016-04-20 MED ORDER — LACTATED RINGERS IV SOLN
INTRAVENOUS | Status: DC
  Administered 2016-04-20 (×2): via INTRAVENOUS

## 2016-04-20 MED ORDER — OXYCODONE-ACETAMINOPHEN 5-325 MG PO TABS: 1.0000 | ORAL_TABLET | ORAL | Status: DC | PRN

## 2016-04-20 MED ORDER — PENICILLIN G POT IN DEXTROSE 60000 UNIT/ML IV SOLN
3.0000 10*6.[IU] | INTRAVENOUS | Status: DC
  Administered 2016-04-20: 3 10*6.[IU] via INTRAVENOUS
  Filled 2016-04-20 (×8): qty 50

## 2016-04-20 MED ORDER — DIBUCAINE 1 % RE OINT: 1.0000 "application " | TOPICAL_OINTMENT | RECTAL | Status: DC | PRN

## 2016-04-20 MED ORDER — SOD CITRATE-CITRIC ACID 500-334 MG/5ML PO SOLN: 30.0000 mL | ORAL | Status: DC | PRN

## 2016-04-20 MED ORDER — OXYTOCIN 40 UNITS IN LACTATED RINGERS INFUSION - SIMPLE MED: 2.5000 [IU]/h | INTRAVENOUS | Status: DC

## 2016-04-20 MED ORDER — LIDOCAINE HCL (PF) 1 % IJ SOLN
30.0000 mL | INTRAMUSCULAR | Status: AC | PRN
  Administered 2016-04-20: 30 mL via SUBCUTANEOUS
  Filled 2016-04-20: qty 30

## 2016-04-20 MED ORDER — OXYCODONE-ACETAMINOPHEN 5-325 MG PO TABS: 2.0000 | ORAL_TABLET | ORAL | Status: DC | PRN

## 2016-04-20 MED ORDER — SIMETHICONE 80 MG PO CHEW: 80.0000 mg | CHEWABLE_TABLET | ORAL | Status: DC | PRN

## 2016-04-20 MED ORDER — FLEET ENEMA 7-19 GM/118ML RE ENEM: 1.0000 | ENEMA | RECTAL | Status: DC | PRN

## 2016-04-20 MED ORDER — WITCH HAZEL-GLYCERIN EX PADS
1.0000 "application " | MEDICATED_PAD | CUTANEOUS | Status: DC | PRN
  Administered 2016-04-21: 1 via TOPICAL

## 2016-04-20 MED ORDER — OXYTOCIN BOLUS FROM INFUSION
500.0000 mL | Freq: Once | INTRAVENOUS | Status: AC
  Administered 2016-04-20: 500 mL via INTRAVENOUS

## 2016-04-20 MED ORDER — IBUPROFEN 600 MG PO TABS
600.0000 mg | ORAL_TABLET | Freq: Four times a day (QID) | ORAL | Status: DC
  Administered 2016-04-20 – 2016-04-22 (×7): 600 mg via ORAL
  Filled 2016-04-20 (×7): qty 1

## 2016-04-20 MED ORDER — LIDOCAINE HCL (PF) 1 % IJ SOLN
INTRAMUSCULAR | Status: DC | PRN
  Administered 2016-04-20: 4 mL via EPIDURAL

## 2016-04-20 MED ORDER — ONDANSETRON HCL 4 MG/2ML IJ SOLN: 4.0000 mg | Freq: Four times a day (QID) | INTRAMUSCULAR | Status: DC | PRN

## 2016-04-20 MED ORDER — PHENYLEPHRINE 40 MCG/ML (10ML) SYRINGE FOR IV PUSH (FOR BLOOD PRESSURE SUPPORT)
80.0000 ug | PREFILLED_SYRINGE | INTRAVENOUS | Status: DC | PRN
  Filled 2016-04-20: qty 5
  Filled 2016-04-20: qty 10

## 2016-04-20 MED ORDER — FENTANYL CITRATE (PF) 100 MCG/2ML IJ SOLN
100.0000 ug | INTRAMUSCULAR | Status: DC | PRN
  Administered 2016-04-20: 100 ug via INTRAVENOUS
  Filled 2016-04-20: qty 2

## 2016-04-20 MED ORDER — PRENATAL MULTIVITAMIN CH
1.0000 | ORAL_TABLET | Freq: Every day | ORAL | Status: DC
  Administered 2016-04-21: 1 via ORAL
  Filled 2016-04-20: qty 1

## 2016-04-20 MED ORDER — TERBUTALINE SULFATE 1 MG/ML IJ SOLN
0.2500 mg | Freq: Once | INTRAMUSCULAR | Status: DC | PRN
  Filled 2016-04-20: qty 1

## 2016-04-20 MED ORDER — OXYTOCIN 40 UNITS IN LACTATED RINGERS INFUSION - SIMPLE MED: 1.0000 m[IU]/min | INTRAVENOUS | Status: DC

## 2016-04-20 NOTE — Anesthesia Postprocedure Evaluation (Signed)
Anesthesia Post Note  Patient: Tina [name]  Procedure(s) Performed: * No procedures listed *  Patient location during evaluation: Mother Baby Anesthesia Type: Epidural Level of consciousness: awake and alert Pain management: pain level controlled Vital Signs Assessment: post-procedure vital signs reviewed and stable Respiratory status: spontaneous breathing and nonlabored ventilation Cardiovascular status: stable Postop Assessment: no headache, patient able to bend at knees, no signs of nausea or vomiting, no backache, epidural receding and adequate PO intake Anesthetic complications: no        Last Vitals:  Vitals:   04/20/16 1535 04/20/16 1630  BP: 114/69 114/61  Pulse: 93 93  Resp: 18   Temp: 37.3 C 37.2 C    Last Pain:  Vitals:   04/20/16 1753  TempSrc:   PainSc: 0-No pain   Pain Goal: Patients Stated Pain Goal: 3 (04/20/16 0720)               [name],Jonell Brumbaugh Hristova

## 2016-04-20 NOTE — Anesthesia Preprocedure Evaluation (Signed)

## 2016-04-20 NOTE — Lactation Note (Signed)
This note was copied from a baby's chart. Lactation Consultation Note  Patient Name: Tina [name] WUJWJ'XToday's Date: 04/20/2016 Reason for consult: Initial assessment Baby at 3 hr of life. Experienced bf mom reports baby is latching well. She denies breast or nipple pain, voiced no concerns. She stopped bf her older child because she went back to work and "did not have enough time". She desires to bf this baby 6498m. Discussed baby behavior, feeding frequency, baby belly size, voids, wt loss, breast changes, and nipple care. She stated she can manually express and has spoon in room. Given lactation handouts. Aware of OP services and support group. She will call as needed.    Maternal Data Has patient been taught Hand Expression?: Yes Does the patient have breastfeeding experience prior to this delivery?: Yes  Feeding Feeding Type: Breast Fed Length of feed: 45 min  LATCH Score/Interventions Latch: Repeated attempts needed to sustain latch, nipple held in mouth throughout feeding, stimulation needed to elicit sucking reflex. Intervention(s): Adjust position;Assist with latch  Audible Swallowing: Spontaneous and intermittent  Type of Nipple: Everted at rest and after stimulation  Comfort (Breast/Nipple): Soft / non-tender     Hold (Positioning): No assistance needed to correctly position infant at breast.  LATCH Score: 9  Lactation Tools Discussed/Used WIC Program: No   Consult Status Consult Status: Follow-up Date: 04/21/16 Follow-up type: In-patient    Tina [name] 04/20/2016, 4:59 PM

## 2016-04-20 NOTE — Anesthesia Procedure Notes (Signed)
Epidural Patient location during procedure: OB Start time: 04/20/2016 9:08 AM End time: 04/20/2016 9:18 AM  Preanesthetic Checklist Completed: patient identified, site marked, surgical consent, pre-op evaluation, timeout performed, IV checked, risks and benefits discussed and monitors and equipment checked  Epidural Patient position: sitting Prep: site prepped and draped and DuraPrep Patient monitoring: continuous pulse ox and blood pressure Approach: midline Location: L3-L4 Injection technique: LOR air  Needle:  Needle type: Tuohy  Needle gauge: 17 G Needle length: 9 cm and 9 Needle insertion depth: 6 cm Catheter type: closed end flexible Catheter size: 19 Gauge Catheter at skin depth: 12 cm Test dose: negative  Assessment Events: blood not aspirated, injection not painful, no injection resistance, negative IV test and no paresthesia  Additional Notes Dosing of Epidural:  1st dose, through catheter ............................................Marland Kitchen.  Xylocaine 40 mg      As each dose occurred, patient was free of IV sx; and patient exhibited no evidence of SA injection.  Patient is more comfortable after epidural dosed. Please see RN's note for documentation of vital signs,and FHR which are stable.  Patient reminded not to try to ambulate with numb legs, and that an RN must be present when she attempts to get up.

## 2016-04-20 NOTE — H&P (Signed)
LABOR AND DELIVERY ADMISSION HISTORY AND PHYSICAL NOTE  Tina [name] is a 26 y.o. female G3P1011 with IUP at [redacted]w[redacted]d by LMP presenting for IOL for postdates.  She has had an uncomplicated pregnancy to date. She started having contractions at 0300 this morning, but denies any leakage of fluid or vaginal bleeding. She also denies any HA, changes in vision, CP, NV or diarrhea. She reports positive fetal movement.   Prenatal History/Complications:  Past Medical History: Past Medical History:  Diagnosis Date  . Medical history non-contributory   . Pregnant     Past Surgical History: Past Surgical History:  Procedure Laterality Date  . DILATION AND CURETTAGE OF UTERUS    . nasal cyst removal    . right arm surgery     Rod and screws placed    Obstetrical History: OB History    Gravida Para Term Preterm AB Living   3 1 1  0 1 1   SAB TAB Ectopic Multiple Live Births   0 0 0 0 1      Social History: Social History   Social History  . Marital status: Single    Spouse name: N/A  . Number of children: N/A  . Years of education: N/A   Social History Main Topics  . Smoking status: Never Smoker  . Smokeless tobacco: Never Used  . Alcohol use No  . Drug use: No  . Sexual activity: Yes    Birth control/ protection: Condom   Other Topics Concern  . None   Social History Narrative  . None    Family History: Family History  Problem Relation Age of Onset  . Cancer Mother   . Diabetes Father   . Hypertension Father     Allergies: No Known Allergies  Prescriptions Prior to Admission  Medication Sig Dispense Refill Last Dose  . ferrous sulfate (FERROUSUL) 325 (65 FE) MG tablet Take 1 tablet (325 mg total) by mouth 2 (two) times daily. (Patient not taking: Reported on 04/09/2016) 60 tablet 1 Not Taking  . Prenatal Vit-Fe Phos-FA-Omega (VITAFOL GUMMIES) 3.33-0.333-34.8 MG CHEW Chew 3 tablets by mouth daily. 90 tablet 12 Taking     Review of Systems   All systems  reviewed and negative except as stated in HPI  Height 5\' 3"  (1.6 m), weight 90.3 kg (199 lb), last menstrual period 07/08/2015, unknown if currently breastfeeding. General appearance: alert and cooperative Lungs: clear to auscultation bilaterally Heart: regular rate and rhythm Abdomen: soft, non-tender; bowel sounds normal Extremities: No calf swelling or tenderness Presentation: cephalic, vertex Fetal monitoring: external Uterine activity:  Dilation: 3.5 Effacement (%): 60 Station: -2 Exam by:: Hart Rochester, CNM   Prenatal labs: ABO, Rh: O/Positive/-- (06/21 1543) Antibody: Negative (06/21 1543) Rubella: Immune RPR: Non Reactive (10/17 1140)  HBsAg: Negative (06/21 1543)  HIV: Non Reactive (10/17 1140)  GBS: Positive (12/18 1636)  1 hr Glucola: 3rd 68/94/75 Genetic screening:  normal Anatomy US: normal female   Prenatal Transfer Tool  Maternal Diabetes: No Genetic Screening: Normal Maternal Ultrasounds/Referrals: Normal Fetal Ultrasounds or other Referrals:  None Maternal Substance Abuse:  No Significant Maternal Medications:  None Significant Maternal Lab Results: Lab values include: Group B Strep positive  No results found for this or any previous visit (from the past 24 hour(s)).  Patient Active Problem List   Diagnosis Date Noted  . Pregnancy 04/20/2016  . 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 Anemia affecting pregnancy in third trimester 01/28/2016  . Supervision of other normal pregnancy, antepartum 09/18/2015  .  Personal history of previous postdates pregnancy 06/22/2014    Assessment: Tina [name] is a 26 y.o. G3P1011 at 2433w0d here for IOL for postdates who started with contractions around 0300 this morning.  She is dilated to 3.5cm and is 60% effaced with -2 station.  She denies leakage of fluids.  GBS pos, starting PCN.   #Labor: IOL with pitocin, expect SVD, intact #Pain: epidural #FWB: Category I #ID: GBS Pos #MOF: breast #MOC: unsure  Renne Muscaaniel L Warden,  MD PGY-1 04/20/2016, 6:36 AM

## 2016-04-20 NOTE — Anesthesia Pain Management Evaluation Note (Signed)
  [facility]NA Pain Management Visit Note  Patient: Tina [name], 26 y.o., female  "Hello I am a member of the anesthesia team at [facility]. We have an anesthesia team available at all times to provide care throughout the hospital, including epidural management and anesthesia for C-section. I don't know your plan for the delivery whether it a natural birth, water birth, IV sedation, nitrous supplementation, doula or epidural, but we want to meet your pain goals."   1.Was your pain managed to your expectations on prior hospitalizations?   Yes   2.What is your expectation for pain management during this hospitalization?     Epidural  3.How can we help you reach that goal?   Record the patient's initial score and the patient's pain goal.   Pain: 5  Pain Goal: 7 [facility] wants you to be able to say your pain was always managed very well.  [name],[name] 04/20/2016

## 2016-04-21 NOTE — Progress Notes (Signed)
POSTPARTUM PROGRESS NOTE  Post Partum Day 1  Subjective:  Tina [name] is a 26 y.o. [id number] 2171w0d s/p SVD.  No acute events overnight.  Pt denies problems with ambulating, voiding or po intake.  She denies nausea or vomiting.  Pain is moderately controlled.  She has had flatus. She has not had bowel movement.  Lochia Small.   Objective: Blood pressure (!) 102/50, pulse 68, temperature 97.8 F (36.6 C), temperature source Oral, resp. rate 18, height 5\' 3"  (1.6 m), weight 199 lb (90.3 kg), last menstrual period 07/08/2015, SpO2 99 %, unknown if currently breastfeeding.  Physical Exam:  General: alert, cooperative and no distress Lochia:normal flow Chest: CTAB Heart: RRR no m/r/g Abdomen: +BS, soft, nontender,  Uterine Fundus: firm, present just inferior to the umbilicus DVT Evaluation: No calf swelling or tenderness Extremities: no lower or upper extremity edema   Recent Labs  04/20/16 0630  HGB 10.0*  HCT 31.2*    Assessment/Plan:  ASSESSMENT: Tina [name] is a 26 y.o. [id number] 7371w0d s/p SVD with spontaneous onset of labor. Initially had planned for IOL secondary to post dates, but she came in laboring.   Plan for discharge tomorrow   LOS: 1 day   Gorden HarmsMegan Ziyad Dyar, MD PGY-2 04/21/2016, 9:32 AM

## 2016-04-21 NOTE — Plan of Care (Signed)
Problem: Nutritional: Goal: Mothers verbalization of comfort with breastfeeding process will improve Encouraged patient to call out when latching baby in order to assess latch. Educated patient on breast care.

## 2016-04-21 NOTE — Lactation Note (Signed)
This note was copied from a baby's chart. Lactation Consultation Note  Patient Name: Tina [name] EAVWU'JToday's Date: 04/21/2016 Reason for consult: Follow-up assessment Baby at 27 hr of life. Upon entry mom was latching baby. Baby has a nice gape, flanged lips, and rhythmic suck. Baby does get sleepy after 10-15 minutes but mom is able to stimulate baby to finish the feeding. Mom denies breast or nipple pain, voiced no concerns. Discussed baby behavior, feeding frequency, baby belly size, voids, wt loss, breast changes, and nipple care. Mom is aware of lactation services and support group. She will call as needed.   Maternal Data    Feeding Feeding Type: Breast Fed Length of feed: 20 min  LATCH Score/Interventions Latch: Grasps breast easily, tongue down, lips flanged, rhythmical sucking.  Audible Swallowing: Spontaneous and intermittent  Type of Nipple: Everted at rest and after stimulation  Comfort (Breast/Nipple): Soft / non-tender     Hold (Positioning): No assistance needed to correctly position infant at breast.  LATCH Score: 10  Lactation Tools Discussed/Used     Consult Status Consult Status: Follow-up Date: 04/22/16 Follow-up type: In-patient    Rulon Eisenmengerlizabeth E Merlyn Bollen 04/21/2016, 5:23 PM

## 2016-04-22 MED ORDER — SENNOSIDES-DOCUSATE SODIUM 8.6-50 MG PO TABS: 2.0000 | ORAL_TABLET | ORAL | 0 refills | Status: DC

## 2016-04-22 MED ORDER — IBUPROFEN 600 MG PO TABS: 600.0000 mg | ORAL_TABLET | Freq: Four times a day (QID) | ORAL | 0 refills | Status: DC

## 2016-04-22 NOTE — Discharge Instructions (Signed)

## 2016-04-22 NOTE — Lactation Note (Addendum)
This note was copied from a baby's chart. Lactation Consultation Note  Mom is reporting nipple tenderness.  Discussed off-center latch as mother reports nipple compression when baby detaches.  Attempted to latch baby to assess feeding but baby was too sleepy. Mom's let nipple is slightly inverted.  Gave mom comfort gel to apply to the left nipple as the inverted area is reddened when it everts.  Advised breast compression to aid in transfer. Phone number left for Mom to call for latch assistance if desired.  Patient Name: Tina [name] GNFAO'ZToday's Date: 04/22/2016 Reason for consult: Follow-up assessment   Maternal Data    Feeding Feeding Type: Breast Fed Nipple Type: Slow - flow  LATCH Score/Interventions Latch: Too sleepy or reluctant, no latch achieved, no sucking elicited. (attempted to feed. baby not hungry)  Audible Swallowing: None  Type of Nipple: Everted at rest and after stimulation  Comfort (Breast/Nipple): Filling, red/small blisters or bruises, mild/mod discomfort     Hold (Positioning): Assistance needed to correctly position infant at breast and maintain latch.  LATCH Score: 4  Lactation Tools Discussed/Used     Consult Status      Tina [name], Tina [name] 04/22/2016, 9:17 AM

## 2016-04-22 NOTE — Plan of Care (Signed)
Problem: Coping: Goal: Ability to identify and utilize available resources and services will improve Discharge education reviewed with patient. Patient verbalizes understanding of information.

## 2016-04-22 NOTE — Discharge Summary (Signed)
OB Discharge Summary     Patient Name: Tina ForestCharlise Radabaugh DOB: Jun 29, 1990 MRN: [id number]  Date of admission: 04/20/2016 Delivering MD: Jen MowMUMAW, Aileana Hodder [facility]   Date of discharge: 04/22/2016  Admitting diagnosis: SCHEDULED TO BE INDUCEED AT 6:30 BUT CAME IN WITH CONTRACTIONS Intrauterine pregnancy: 2574w0d     Secondary diagnosis:  Principal Problem:   Vaginal delivery Active Problems:   Pregnancy  Additional problems: None     Discharge diagnosis: Term Pregnancy Delivered                                                                                                Post partum procedures:None  Augmentation: Pitocin  Complications: None  Hospital course:  Induction of Labor With Vaginal Delivery   26 y.o. yo [id number] at 1874w0d was admitted to the hospital 04/20/2016 for induction of labor.  Indication for induction: Postdates.  Patient had an uncomplicated labor course as follows: Membrane Rupture Time/Date: 1:01 PM ,04/20/2016   Intrapartum Procedures: Episiotomy: None [1]                                         Lacerations:  1st degree [2]  Patient had delivery of a Viable infant.  Information for the patient's newborn:  Tina [name], Girl [place] [[id number]][[id number]]  Delivery Method: Vag-Spont   04/20/2016  Details of delivery can be found in separate delivery note.  Patient had a routine postpartum course. Patient is discharged home 04/22/16.  Physical exam  Vitals:   04/20/16 1630 04/20/16 2030 04/21/16 0430 04/22/16 0549  BP: 114/61 (!) 112/53 (!) 102/50 124/72  Pulse: 93 89 68 72  Resp:  18 18 18   Temp: 98.9 F (37.2 C) 98.7 F (37.1 C) 97.8 F (36.6 C) 98 F (36.7 C)  TempSrc:  Oral Oral Oral  SpO2:  98% 99%   Weight:      Height:       General: alert, cooperative and no distress Lochia: appropriate Uterine Fundus: firm Incision: N/A DVT Evaluation: No evidence of DVT seen on physical exam. Negative Homan's sign. No significant calf/ankle edema. Labs: Lab Results   Component Value Date   WBC 15.2 (H) 04/20/2016   HGB 10.0 (L) 04/20/2016   HCT 31.2 (L) 04/20/2016   MCV 75.0 (L) 04/20/2016   PLT 227 04/20/2016   CMP Latest Ref Rng & Units 02/23/2016  Glucose 65 - 99 mg/dL 93  BUN 6 - 20 mg/dL <1(H<5(L)  Creatinine 0.860.44 - 1.00 mg/dL 5.780.74  Sodium 469135 - 629145 mmol/L 135  Potassium 3.5 - 5.1 mmol/L 3.5  Chloride 101 - 111 mmol/L 107  CO2 22 - 32 mmol/L 22  Calcium 8.9 - 10.3 mg/dL 9.0  Total Protein 6.5 - 8.1 g/dL 5.2(W5.8(L)  Total Bilirubin 0.3 - 1.2 mg/dL 0.4  Alkaline Phos 38 - 126 U/L 80  AST 15 - 41 U/L 18  ALT 14 - 54 U/L 15    Discharge instruction: per After Visit Summary and "Baby and Me Booklet".  After visit  meds:  Allergies as of 04/22/2016   No Known Allergies     Medication List    TAKE these medications   ferrous sulfate 325 (65 FE) MG tablet Commonly known as:  FERROUSUL Take 1 tablet (325 mg total) by mouth 2 (two) times daily.   ibuprofen 600 MG tablet Commonly known as:  ADVIL,MOTRIN Take 1 tablet (600 mg total) by mouth every 6 (six) hours.   senna-docusate 8.6-50 MG tablet Commonly known as:  Senokot-S Take 2 tablets by mouth daily. Start taking on:  04/23/2016   VITAFOL GUMMIES 3.33-0.333-34.8 MG Chew Chew 3 tablets by mouth daily.       Diet: routine diet  Activity: Advance as tolerated. Pelvic rest for 6 weeks.   Outpatient follow up:6 weeks Follow up Appt:No future appointments. Follow up Visit:No Follow-up on file.  Postpartum contraception: Undecided. Considering paraguard IUD  Newborn Data: Live born female  Birth Weight: 8 lb 3.6 oz (3730 g) APGAR: 9, 9  Baby Feeding: Breast Disposition:home with mother   04/22/2016 Gorden Harms, MD  OB FELLOW DISCHARGE ATTESTATION  I have seen and examined this patient and agree with above documentation in the resident's note.   Jen Mow, DO OB Fellow

## 2016-04-25 NOTE — H&P (Signed)
LABOR AND DELIVERY ADMISSION HISTORY AND PHYSICAL NOTE  Tina [name] is a 26 y.o. female G3P1011 with IUP at [redacted]w[redacted]d by LMP presenting for IOL for postdates.  She has had an uncomplicated pregnancy to date. She started having contractions at 0300 this morning, but denies any leakage of fluid or vaginal bleeding. She also denies any HA, changes in vision, CP, NV or diarrhea. She reports positive fetal movement.   Prenatal History/Complications:  Past Medical History: Past Medical History:  Diagnosis Date  . Medical history non-contributory   . Pregnant     Past Surgical History: Past Surgical History:  Procedure Laterality Date  . DILATION AND CURETTAGE OF UTERUS    . nasal cyst removal    . right arm surgery     Rod and screws placed    Obstetrical History: OB History    Gravida Para Term Preterm AB Living   3 1 1 0 1 1   SAB TAB Ectopic Multiple Live Births   0 0 0 0 1      Social History: Social History   Social History  . Marital status: Single    Spouse name: N/A  . Number of children: N/A  . Years of education: N/A   Social History Main Topics  . Smoking status: Never Smoker  . Smokeless tobacco: Never Used  . Alcohol use No  . Drug use: No  . Sexual activity: Yes    Birth control/ protection: Condom   Other Topics Concern  . None   Social History Narrative  . None    Family History: Family History  Problem Relation Age of Onset  . Cancer Mother   . Diabetes Father   . Hypertension Father     Allergies: No Known Allergies  Prescriptions Prior to Admission  Medication Sig Dispense Refill Last Dose  . ferrous sulfate (FERROUSUL) 325 (65 FE) MG tablet Take 1 tablet (325 mg total) by mouth 2 (two) times daily. (Patient not taking: Reported on 04/09/2016) 60 tablet 1 Not Taking  . Prenatal Vit-Fe Phos-FA-Omega (VITAFOL GUMMIES) 3.33-0.333-34.8 MG CHEW Chew 3 tablets by mouth daily. 90 tablet 12 Taking     Review of Systems   All systems  reviewed and negative except as stated in HPI  Height 5' 3" (1.6 m), weight 90.3 kg (199 lb), last menstrual period 07/08/2015, unknown if currently breastfeeding. General appearance: alert and cooperative Lungs: clear to auscultation bilaterally Heart: regular rate and rhythm Abdomen: soft, non-tender; bowel sounds normal Extremities: No calf swelling or tenderness Presentation: cephalic, vertex Fetal monitoring: external Uterine activity:  Dilation: 3.5 Effacement (%): 60 Station: -2 Exam by:: Shahid Flori, CNM   Prenatal labs: ABO, Rh: O/Positive/-- (06/21 1543) Antibody: Negative (06/21 1543) Rubella: Immune RPR: Non Reactive (10/17 1140)  HBsAg: Negative (06/21 1543)  HIV: Non Reactive (10/17 1140)  GBS: Positive (12/18 1636)  1 hr Glucola: 3rd 68/94/75 Genetic screening:  normal Anatomy US: normal female   Prenatal Transfer Tool  Maternal Diabetes: No Genetic Screening: Normal Maternal Ultrasounds/Referrals: Normal Fetal Ultrasounds or other Referrals:  None Maternal Substance Abuse:  No Significant Maternal Medications:  None Significant Maternal Lab Results: Lab values include: Group B Strep positive  No results found for this or any previous visit (from the past 24 hour(s)).  Patient Active Problem List   Diagnosis Date Noted  . Pregnancy 04/20/2016  . 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 Anemia affecting pregnancy in third trimester 01/28/2016  . Supervision of other normal pregnancy, antepartum 09/18/2015  .   Personal history of previous postdates pregnancy 06/22/2014    Assessment: Tina [name] is a 26 y.o. G3P1011 at [redacted]w[redacted]d here for IOL for postdates who started with contractions around 0300 this morning.  She is dilated to 3.5cm and is 60% effaced with -2 station.  She denies leakage of fluids.  GBS pos, starting PCN.   #Labor: IOL with pitocin, expect SVD, intact #Pain: epidural #FWB: Category I #ID: GBS Pos #MOF: breast #MOC: unsure  Daniel L Warden,  MD PGY-1 04/20/2016, 6:36 AM     

## 2016-06-09 ENCOUNTER — Ambulatory Visit (INDEPENDENT_AMBULATORY_CARE_PROVIDER_SITE_OTHER): Admitting: Certified Nurse Midwife

## 2016-06-09 ENCOUNTER — Encounter: Payer: Self-pay | Admitting: Certified Nurse Midwife

## 2016-06-09 DIAGNOSIS — Z124 Encounter for screening for malignant neoplasm of cervix: Secondary | ICD-10-CM

## 2016-06-09 NOTE — Progress Notes (Signed)
Subjective:     Tina [name] is a 26 y.o. female who presents for a postpartum visit. She is 6 weeks postpartum following a spontaneous vaginal delivery. I have fully reviewed the prenatal and intrapartum course. The delivery was at 41 weeks, IOL for postdates gestational weeks. Outcome: spontaneous vaginal delivery. Anesthesia: epidural. Postpartum course has been normal. Baby's course has been normal. Baby is feeding by breast. Bleeding no bleeding. Bowel function is normal. Bladder function is normal. Patient is not sexually active. Contraception method is abstinence. Postpartum depression screening: negative.  Tobacco, alcohol and substance abuse history reviewed.  Adult immunizations reviewed including TDAP, rubella and varicella.  The following portions of the patient's history were reviewed and updated as appropriate: allergies, current medications, past family history, past medical history, past social history, past surgical history and problem list.  Review of Systems Pertinent items noted in HPI and remainder of comprehensive ROS otherwise negative.   Objective:    BP 112/80   Pulse 87   Temp 98.8 F (37.1 C) (Oral)   Wt 178 lb 9.6 oz (81 kg)   Breastfeeding? Yes   BMI 31.64 kg/m   General:  alert, cooperative and no distress   Breasts:  inspection negative, no nipple discharge or bleeding, no masses or nodularity palpable  Lungs: clear to auscultation bilaterally  Heart:  regular rate and rhythm, S1, S2 normal, no murmur, click, rub or gallop  Abdomen: soft, non-tender; bowel sounds normal; no masses,  no organomegaly   Vulva:  normal  Vagina: normal vagina, no discharge, exudate, lesion, or erythema  Cervix:  no bleeding following Pap and no cervical motion tenderness  Corpus: normal size, contour, position, consistency, mobility, non-tender  Adnexa:  normal adnexa  Rectal Exam: Not performed.          50% of 30 min visit spent on counseling and coordination of care.    Assessment:     Normal 6 week postpartum exam. Pap smear done at today's visit.     Contraception discussed. ?IUD   Plan:    1. Contraception: abstinence 2. Possible IUD in the future 3. Follow up in: 1 year or as needed.  2hr GTT for h/o GDM/screening for DM q 3 yrs per ADA recommendations Preconception counseling provided Healthy lifestyle practices reviewed

## 2016-06-09 NOTE — Progress Notes (Signed)
Patient is in office for pp follow up., vaginal delivery on 04-20-16. She has no complaints, [facility]. PP depression score =Neg

## 2016-06-12 LAB — CYTOLOGY - PAP: Diagnosis: NEGATIVE

## 2016-06-15 ENCOUNTER — Other Ambulatory Visit: Payer: Self-pay | Admitting: Certified Nurse Midwife

## 2016-06-17 ENCOUNTER — Encounter: Payer: Self-pay | Admitting: *Deleted

## 2018-10-10 ENCOUNTER — Other Ambulatory Visit: Payer: Self-pay

## 2018-10-10 ENCOUNTER — Emergency Department (HOSPITAL_COMMUNITY)
Admission: EM | Admit: 2018-10-10 | Discharge: 2018-10-11 | Disposition: A | Discharge status: Other Acute Inpatient Hospital | Payer: 59 | Source: Home / Self Care | Attending: Emergency Medicine | Admitting: Emergency Medicine

## 2018-10-10 ENCOUNTER — Encounter (HOSPITAL_COMMUNITY): Payer: Self-pay | Admitting: Emergency Medicine

## 2018-10-10 DIAGNOSIS — R45851 Suicidal ideations: Secondary | ICD-10-CM | POA: Diagnosis not present

## 2018-10-10 DIAGNOSIS — F332 Major depressive disorder, recurrent severe without psychotic features: Secondary | ICD-10-CM | POA: Diagnosis not present

## 2018-10-10 DIAGNOSIS — Z046 Encounter for general psychiatric examination, requested by authority: Secondary | ICD-10-CM | POA: Insufficient documentation

## 2018-10-10 DIAGNOSIS — R51 Headache: Secondary | ICD-10-CM | POA: Insufficient documentation

## 2018-10-10 DIAGNOSIS — F329 Major depressive disorder, single episode, unspecified: Secondary | ICD-10-CM | POA: Insufficient documentation

## 2018-10-10 DIAGNOSIS — Z20828 Contact with and (suspected) exposure to other viral communicable diseases: Secondary | ICD-10-CM | POA: Insufficient documentation

## 2018-10-10 NOTE — ED Notes (Signed)
Pt gives permission for Nursing staff to talk with significant other r/t to current visit in her presence Significant other: Claude Manges
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
## 2018-10-11 ENCOUNTER — Inpatient Hospital Stay (HOSPITAL_COMMUNITY)
Admission: AD | Admit: 2018-10-11 | Discharge: 2018-10-14 | DRG: 885 | Disposition: A | Discharge status: Home / Self Care | Payer: 59 | Source: Intra-hospital | Attending: Psychiatry | Admitting: Psychiatry

## 2018-10-11 ENCOUNTER — Encounter (HOSPITAL_COMMUNITY): Payer: Self-pay | Admitting: *Deleted

## 2018-10-11 DIAGNOSIS — F4312 Post-traumatic stress disorder, chronic: Secondary | ICD-10-CM | POA: Diagnosis not present

## 2018-10-11 DIAGNOSIS — F332 Major depressive disorder, recurrent severe without psychotic features: Principal | ICD-10-CM | POA: Diagnosis present

## 2018-10-11 DIAGNOSIS — R45851 Suicidal ideations: Secondary | ICD-10-CM | POA: Diagnosis present

## 2018-10-11 DIAGNOSIS — F4310 Post-traumatic stress disorder, unspecified: Secondary | ICD-10-CM | POA: Diagnosis present

## 2018-10-11 DIAGNOSIS — Z1159 Encounter for screening for other viral diseases: Secondary | ICD-10-CM | POA: Diagnosis not present

## 2018-10-11 DIAGNOSIS — F339 Major depressive disorder, recurrent, unspecified: Secondary | ICD-10-CM | POA: Diagnosis present

## 2018-10-11 LAB — COMPREHENSIVE METABOLIC PANEL
ALT: 14 U/L (ref 0–44)
AST: 15 U/L (ref 15–41)
Albumin: 4.1 g/dL (ref 3.5–5.0)
Alkaline Phosphatase: 56 U/L (ref 38–126)
Anion gap: 9 (ref 5–15)
BUN: 18 mg/dL (ref 6–20)
CO2: 21 mmol/L — ABNORMAL LOW (ref 22–32)
Calcium: 8.9 mg/dL (ref 8.9–10.3)
Chloride: 107 mmol/L (ref 98–111)
Creatinine, Ser: 0.87 mg/dL (ref 0.44–1.00)
GFR calc Af Amer: >60 mL/min (ref >60)
GFR calc non Af Amer: >60 mL/min (ref >60)
Glucose, Bld: 91 mg/dL (ref 70–99)
Potassium: 3.8 mmol/L (ref 3.5–5.1)
Sodium: 137 mmol/L (ref 135–145)
Total Bilirubin: 0.6 mg/dL (ref 0.3–1.2)
Total Protein: 7.2 g/dL (ref 6.5–8.1)

## 2018-10-11 LAB — CBC WITH DIFFERENTIAL/PLATELET
Abs Immature Granulocytes: 0.03 10*3/uL (ref 0.00–0.07)
Basophils Absolute: 0.1 10*3/uL (ref 0.0–0.1)
Basophils Relative: 1 %
Eosinophils Absolute: 0.2 10*3/uL (ref 0.0–0.5)
Eosinophils Relative: 1 %
HCT: 39.2 % (ref 36.0–46.0)
Hemoglobin: 12.7 g/dL (ref 12.0–15.0)
Immature Granulocytes: 0 %
Lymphocytes Relative: 25 %
Lymphs Abs: 2.6 10*3/uL (ref 0.7–4.0)
MCH: 29.4 pg (ref 26.0–34.0)
MCHC: 32.4 g/dL (ref 30.0–36.0)
MCV: 90.7 fL (ref 80.0–100.0)
Monocytes Absolute: 0.6 10*3/uL (ref 0.1–1.0)
Monocytes Relative: 6 %
Neutro Abs: 7 10*3/uL (ref 1.7–7.7)
Neutrophils Relative %: 67 %
Platelets: 261 10*3/uL (ref 150–400)
RBC: 4.32 MIL/uL (ref 3.87–5.11)
RDW: 12.8 % (ref 11.5–15.5)
WBC: 10.4 10*3/uL (ref 4.0–10.5)
nRBC: 0 % (ref 0.0–0.2)

## 2018-10-11 LAB — RAPID URINE DRUG SCREEN, HOSP PERFORMED
Amphetamines: NOT DETECTED
Barbiturates: NOT DETECTED
Benzodiazepines: NOT DETECTED
Cocaine: NOT DETECTED
Opiates: NOT DETECTED
Tetrahydrocannabinol: NOT DETECTED

## 2018-10-11 LAB — I-STAT BETA HCG BLOOD, ED (MC, WL, AP ONLY): I-stat hCG, quantitative: <5 m[IU]/mL (ref <5)

## 2018-10-11 LAB — ETHANOL: Alcohol, Ethyl (B): <10 mg/dL (ref <10)

## 2018-10-11 LAB — SARS CORONAVIRUS 2 BY RT PCR (HOSPITAL ORDER, PERFORMED IN ~~LOC~~ HOSPITAL LAB): SARS Coronavirus 2: NEGATIVE

## 2018-10-11 LAB — ACETAMINOPHEN LEVEL: Acetaminophen (Tylenol), Serum: <10 ug/mL — ABNORMAL LOW (ref 10–30)

## 2018-10-11 LAB — SALICYLATE LEVEL: Salicylate Lvl: <7 mg/dL (ref 2.8–30.0)

## 2018-10-11 MED ORDER — ACETAMINOPHEN 325 MG PO TABS
650.0000 mg | ORAL_TABLET | Freq: Once | ORAL | Status: AC
  Administered 2018-10-11: 03:00:00 650 mg via ORAL
  Filled 2018-10-11: qty 2

## 2018-10-11 MED ORDER — NICOTINE 21 MG/24HR TD PT24: 21.0000 mg | MEDICATED_PATCH | Freq: Every day | TRANSDERMAL | Status: DC

## 2018-10-11 MED ORDER — ACETAMINOPHEN 325 MG PO TABS: 650.0000 mg | ORAL_TABLET | ORAL | Status: DC | PRN

## 2018-10-11 MED ORDER — ONDANSETRON HCL 4 MG PO TABS: 4.0000 mg | ORAL_TABLET | Freq: Three times a day (TID) | ORAL | Status: DC | PRN

## 2018-10-11 NOTE — Discharge Summary (Addendum)
  Patient to be transferred [facility] for inpatient psychic treatment  Shuvon B. Rankin, NP   Patient seen by telemedicine for psychiatric evaluation, chart reviewed and case discussed with the physician extender and developed treatment plan. Reviewed the information documented and agree with the treatment plan.  Buford Dresser, DO 10/11/18 5:25 PM

## 2018-10-11 NOTE — Progress Notes (Signed)
Patient states that she is in the hospital to face her issues. She described her day as having been "so so". Her goal for tomorrow is to be less judgmental of others.

## 2018-10-11 NOTE — BH Assessment (Signed)
[facility] Assessment Progress Note  Per Buford Dresser, DO, this pt requires psychiatric hospitalization at this time.  Letitia Libra, RN, [facility] has assigned pt to [facility] Rm 406-2; [facility] will be ready to receive pt at 15:30.  Pt has signed Voluntary Admission and Consent for Treatment, as well as Consent to Release Information to no one, and signed forms have been faxed to [facility].  Pt's nurse, Caren Griffins, has been notified, and agrees to send original paperwork along with pt via [place], and to call report to [phone].  Jalene Mullet, [place] Coordinator [phone]
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
## 2018-10-11 NOTE — ED Notes (Signed)
Patient alert, oriented, calm and cooperative.  Assisted patient in finding her boss's number so she could call him and say she would not be at work today.  Patient denies SI, HI, and AV hallucinations.
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
## 2018-10-11 NOTE — ED Notes (Signed)
Pt alert and oriented. Pt c/o of a headache 7/10 pain. Dr Randal Buba made aware. Pt denies any SI,HI, or AVH  At this time. Pt reports she feels safe and contracts to safety. Pt resting in bed. Pt is calm and cooperative. Pt safe will continue to monitor.

## 2018-10-11 NOTE — Progress Notes (Signed)
Pt accepted to  [facility], Bed 406-2 Tina Rankin, NP is the accepting provider.  Myles Lipps, MD is the attending provider.  Call report to [phone]  [facility] ED notified.   Pt is Voluntary Pt may be transported by [facility] Pt scheduled  to arrive at 1530.  Areatha Keas. Judi Cong, MSW, [facility] Disposition Clinical Social Work [phone] (cell) [phone] (office)

## 2018-10-11 NOTE — ED Notes (Signed)
Pt belongings placed in locker 44.

## 2018-10-11 NOTE — ED Notes (Signed)
Report called to [facility].  [facility] called for transport.

## 2018-10-11 NOTE — ED Notes (Signed)
Per EDPA Rob: Pt is VOLUNTARY but if she tries to leave, he will IVC her

## 2018-10-12 DIAGNOSIS — F339 Major depressive disorder, recurrent, unspecified: Secondary | ICD-10-CM | POA: Diagnosis present

## 2018-10-12 DIAGNOSIS — F4312 Post-traumatic stress disorder, chronic: Secondary | ICD-10-CM

## 2018-10-12 DIAGNOSIS — R45851 Suicidal ideations: Secondary | ICD-10-CM

## 2018-10-12 MED ORDER — VENLAFAXINE HCL ER 37.5 MG PO CP24
37.5000 mg | ORAL_CAPSULE | Freq: Every day | ORAL | Status: DC
  Administered 2018-10-12 – 2018-10-14 (×3): 37.5 mg via ORAL
  Filled 2018-10-12 (×6): qty 1

## 2018-10-12 MED ORDER — PRAZOSIN HCL 1 MG PO CAPS
1.0000 mg | ORAL_CAPSULE | Freq: Every day | ORAL | Status: DC
  Administered 2018-10-12: 21:00:00 1 mg via ORAL
  Filled 2018-10-12 (×3): qty 1

## 2018-10-12 MED ORDER — ACETAMINOPHEN 325 MG PO TABS: 650.0000 mg | ORAL_TABLET | Freq: Four times a day (QID) | ORAL | Status: DC | PRN

## 2018-10-12 MED ORDER — TRAZODONE HCL 50 MG PO TABS
50.0000 mg | ORAL_TABLET | Freq: Every evening | ORAL | Status: DC | PRN
  Administered 2018-10-12: 50 mg via ORAL
  Filled 2018-10-12: qty 2

## 2018-10-12 MED ORDER — HYDROXYZINE HCL 10 MG PO TABS
10.0000 mg | ORAL_TABLET | ORAL | Status: DC | PRN
  Administered 2018-10-13: 22:00:00 10 mg via ORAL
  Filled 2018-10-12: qty 1

## 2018-10-12 MED ORDER — ALUM & MAG HYDROXIDE-SIMETH 200-200-20 MG/5ML PO SUSP: 30.0000 mL | ORAL | Status: DC | PRN

## 2018-10-12 MED ORDER — MAGNESIUM HYDROXIDE 400 MG/5ML PO SUSP: 30.0000 mL | Freq: Every day | ORAL | Status: DC | PRN

## 2018-10-12 NOTE — [facility] Group Notes (Signed)
Occupational Therapy Group Note  Date:  10/12/2018 Time:  10:53 AM  Group Topic/Focus:  Self Esteem Action Plan:   The focus of this group is to help patients create a plan to continue to build self-esteem after discharge.  Participation Level:  Active  Participation Quality:  Appropriate  Affect:  Depressed and Flat  Cognitive:  Appropriate  Insight: Improving  Engagement in Group:  Engaged  Modes of Intervention:  Activity, Discussion, Education and Socialization  Additional Comments:    S: "Dedicated is what I describe myself as"  O: OT tx with focus on self esteem building this date. Education given on definition of self esteem, with both causes of low and high self esteem identified. Activity given for pt to identify a positive/aspiring trait for each letter of the alphabet. Pt to work with peers to help complete activity and build positive thinking.   A: Pt presents with flat/depressed affect, engaged and participatory throughout session. She was briefly pulled by a provider during discussion, but completed A-Z activity entirely without VC's.  P: OT group will be x1 per week while pt inpatient.   Zenovia Jarred, MSOT, OTR/L Behavioral Health OT/ Acute Relief OT PHP Office: [place] 10/12/2018, 10:53 AM

## 2018-10-12 NOTE — Progress Notes (Signed)
Patient ID: Tina [name], female   DOB: December 02, 1990, 28 y.o.   MRN: [id number] D) Pt affect and moods have been flat and depressed. Pt forwards little and is minimizing on assessment. Pt is isolative to room, seclusive to self. Rating her depression as a 1/10 and anxiety 3/10 with 10 being the worst. Pt also rates hopelessness as a 1/10. Pt c/o decreased sleep last night and not getting sleep medication. Writer explained that Trazodone has been ordered as an as needed medication. Pt states she doesn't think she's depressed just that "I don't have a bubbly personality" and doesn't like to socialize. Pt contracts for safety. A) Level 3 obs for safety. Support and encouragement provided. Med ed reinforced. R) Guarded. Minimizing.

## 2018-10-12 NOTE — Progress Notes (Signed)
Nursing Progress Note: 7p-7a D: Pt currently presents with a depressed/sad/flat affect and behavior. Interacting appropriately with the milieu. Pt reports off and on sleep during the previous night with current medication regimen.  A: Pt provided with medications per providers orders. Pt's labs and vitals were monitored throughout the night. Pt supported emotionally and encouraged to express concerns and questions. Pt educated on medications.  R: Pt's safety ensured with 15 minute and environmental checks. Pt currently denies SI, HI, and AVH. Pt verbally contracts to seek staff if SI,HI, or AVH occurs and to consult with staff before acting on any harmful thoughts. Will continue to monitor.    [place] NOVEL CORONAVIRUS (COVID-19) DAILY CHECK-OFF SYMPTOMS - answer yes or no to each - every day NO YES  Have you had a fever in the past 24 hours?  . Fever (Temp > 37.80C / 100F) X   Have you had any of these symptoms in the past 24 hours? . New Cough .  Sore Throat  .  Shortness of Breath .  Difficulty Breathing .  Unexplained Body Aches   X   Have you had any one of these symptoms in the past 24 hours not related to allergies?   . Runny Nose .  Nasal Congestion .  Sneezing   X   If you have had runny nose, nasal congestion, sneezing in the past 24 hours, has it worsened?  X   EXPOSURES - check yes or no X   Have you traveled outside the state in the past 14 days?  X   Have you been in contact with someone with a confirmed diagnosis of COVID-19 or PUI in the past 14 days without wearing appropriate PPE?  X   Have you been living in the same home as a person with confirmed diagnosis of COVID-19 or a PUI (household contact)?    X   Have you been diagnosed with COVID-19?    X              What to do next: Answered NO to all: Answered YES to anything:   Proceed with unit schedule Follow the [facility] Inpatient Flowsheet.

## 2018-10-12 NOTE — Progress Notes (Signed)
Patient ID: Tina [name], female   DOB: 1990-09-04, 28 y.o.   MRN: [id number] [place] NOVEL CORONAVIRUS (COVID-19) DAILY CHECK-OFF SYMPTOMS - answer yes or no to each - every day NO YES  Have you had a fever in the past 24 hours?  . Fever (Temp > 37.80C / 100F) X   Have you had any of these symptoms in the past 24 hours? . New Cough .  Sore Throat  .  Shortness of Breath .  Difficulty Breathing .  Unexplained Body Aches   X   Have you had any one of these symptoms in the past 24 hours not related to allergies?   . Runny Nose .  Nasal Congestion .  Sneezing   X   If you have had runny nose, nasal congestion, sneezing in the past 24 hours, has it worsened?  X   EXPOSURES - check yes or no X   Have you traveled outside the state in the past 14 days?  X   Have you been in contact with someone with a confirmed diagnosis of COVID-19 or PUI in the past 14 days without wearing appropriate PPE?  X   Have you been living in the same home as a person with confirmed diagnosis of COVID-19 or a PUI (household contact)?    X   Have you been diagnosed with COVID-19?    X              What to do next: Answered NO to all: Answered YES to anything:   Proceed with unit schedule Follow the [facility] Inpatient Flowsheet.
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
## 2018-10-12 NOTE — Tx Team (Signed)
Interdisciplinary Treatment and Diagnostic Plan Update  10/12/2018 Time of Session:  Tina [name] MRN: [id number]  Principal Diagnosis: <principal problem not specified>  Secondary Diagnoses: Active Problems:   MDD (major depressive disorder), recurrent episode, severe (HCC)   Major depression, recurrent ([place])   Current Medications:  Current Facility-Administered Medications  Medication Dose Route Frequency Provider Last Rate Last Dose  . acetaminophen (TYLENOL) tablet 650 mg  650 mg Oral Q6H PRN Sharma Covert, MD      . alum & mag hydroxide-simeth (MAALOX/MYLANTA) 200-200-20 MG/5ML suspension 30 mL  30 mL Oral Q4H PRN Sharma Covert, MD      . hydrOXYzine (ATARAX/VISTARIL) tablet 10 mg  10 mg Oral Q4H PRN Sharma Covert, MD      . magnesium hydroxide (MILK OF MAGNESIA) suspension 30 mL  30 mL Oral Daily PRN Sharma Covert, MD      . prazosin (MINIPRESS) capsule 1 mg  1 mg Oral QHS Sharma Covert, MD      . traZODone (DESYREL) tablet 50 mg  50 mg Oral QHS PRN Sharma Covert, MD      . venlafaxine XR (EFFEXOR-XR) 24 hr capsule 37.5 mg  37.5 mg Oral Q breakfast Sharma Covert, MD       PTA Medications: No medications prior to admission.    Patient Stressors:    Patient Strengths:    Treatment Modalities: Medication Management, Group therapy, Case management,  1 to 1 session with clinician, Psychoeducation, Recreational therapy.   Physician Treatment Plan for Primary Diagnosis: <principal problem not specified> Long Term Goal(s):     Short Term Goals:    Medication Management: Evaluate patient's response, side effects, and tolerance of medication regimen.  Therapeutic Interventions: 1 to 1 sessions, Unit Group sessions and Medication administration.  Evaluation of Outcomes: Not Met  Physician Treatment Plan for Secondary Diagnosis: Active Problems:   MDD (major depressive disorder), recurrent episode, severe (HCC)   Major depression,  recurrent ([place])  Long Term Goal(s):     Short Term Goals:       Medication Management: Evaluate patient's response, side effects, and tolerance of medication regimen.  Therapeutic Interventions: 1 to 1 sessions, Unit Group sessions and Medication administration.  Evaluation of Outcomes: Not Met   RN Treatment Plan for Primary Diagnosis: <principal problem not specified> Long Term Goal(s): Knowledge of disease and therapeutic regimen to maintain health will improve  Short Term Goals: Ability to participate in decision making will improve, Ability to verbalize feelings will improve, Ability to disclose and discuss suicidal ideas, Ability to identify and develop effective coping behaviors will improve and Compliance with prescribed medications will improve  Medication Management: RN will administer medications as ordered by provider, will assess and evaluate patient's response and provide education to patient for prescribed medication. RN will report any adverse and/or side effects to prescribing provider.  Therapeutic Interventions: 1 on 1 counseling sessions, Psychoeducation, Medication administration, Evaluate responses to treatment, Monitor vital signs and CBGs as ordered, Perform/monitor CIWA, COWS, AIMS and Fall Risk screenings as ordered, Perform wound care treatments as ordered.  Evaluation of Outcomes: Not Met   LCSW Treatment Plan for Primary Diagnosis: <principal problem not specified> Long Term Goal(s): Safe transition to appropriate next level of care at discharge, Engage patient in therapeutic group addressing interpersonal concerns.  Short Term Goals: Engage patient in aftercare planning with referrals and resources  Therapeutic Interventions: Assess for all discharge needs, 1 to 1 time with Social worker, Explore available  resources and support systems, Assess for adequacy in community support network, Educate family and significant other(s) on suicide prevention, Complete  Psychosocial Assessment, Interpersonal group therapy.  Evaluation of Outcomes: Not Met   Progress in Treatment: Attending groups: No. New to unit  Participating in groups: No. Taking medication as prescribed: Yes. Toleration medication: Yes. Family/Significant other contact made: No, will contact:  patient declined consent for collateral contacts Patient understands diagnosis: Yes. Discussing patient identified problems/goals with staff: Yes. Medical problems stabilized or resolved: Yes. Denies suicidal/homicidal ideation: Yes. Issues/concerns per patient self-inventory: No. Other:   New problem(s) identified: None   New Short Term/Long Term Goal(s): medication stabilization, elimination of SI thoughts, development of comprehensive mental wellness plan.    Patient Goals:    Discharge Plan or Barriers: Patient recently admitted. CSW will continue to follow and assess for appropriate referrals and possible discharge planning.    Reason for Continuation of Hospitalization: Anxiety Depression Medication stabilization  Estimated Length of Stay: 3-5 days   Attendees: Patient: 10/12/2018 11:40 AM  Physician: Dr. Myles Lipps, MD 10/12/2018 11:40 AM  Nursing: Selinda Eon.Jerilynn Mages, RN 10/12/2018 11:40 AM  RN Care Manager: 10/12/2018 11:40 AM  Social Worker: Radonna Ricker, [place] 10/12/2018 11:40 AM  Recreational Therapist:  10/12/2018 11:40 AM  Other:  10/12/2018 11:40 AM  Other:  10/12/2018 11:40 AM  Other: 10/12/2018 11:40 AM    Scribe for Treatment Team: Marylee Floras, [place] 10/12/2018 11:40 AM
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
## 2018-10-13 DIAGNOSIS — F332 Major depressive disorder, recurrent severe without psychotic features: Principal | ICD-10-CM

## 2018-10-13 LAB — TSH: TSH: 1.516 u[IU]/mL (ref 0.350–4.500)

## 2018-10-13 MED ORDER — MELATONIN 5 MG PO TABS
5.0000 mg | ORAL_TABLET | Freq: Every evening | ORAL | Status: DC | PRN
  Administered 2018-10-13: 22:00:00 5 mg via ORAL
  Filled 2018-10-13 (×3): qty 1

## 2018-10-13 NOTE — Progress Notes (Signed)
[facility] NOVEL CORONAVIRUS (COVID-19) DAILY CHECK-OFF SYMPTOMS - answer yes or no to each - every day NO YES  Have you had a fever in the past 24 hours?  . Fever (Temp > 37.80C / 100F) X   Have you had any of these symptoms in the past 24 hours? . New Cough .  Sore Throat  .  Shortness of Breath .  Difficulty Breathing .  Unexplained Body Aches   X   Have you had any one of these symptoms in the past 24 hours not related to allergies?   . Runny Nose .  Nasal Congestion .  Sneezing   X   If you have had runny nose, nasal congestion, sneezing in the past 24 hours, has it worsened?  X   EXPOSURES - check yes or no X   Have you traveled outside the state in the past 14 days?  X   Have you been in contact with someone with a confirmed diagnosis of COVID-19 or PUI in the past 14 days without wearing appropriate PPE?  X   Have you been living in the same home as a person with confirmed diagnosis of COVID-19 or a PUI (household contact)?    X   Have you been diagnosed with COVID-19?    X              What to do next: Answered NO to all: Answered YES to anything:   Proceed with unit schedule Follow the [facility] Inpatient Flowsheet.   

## 2018-10-13 NOTE — Plan of Care (Signed)
Nurse discussed anxiety, depression and coping skills with patient.  

## 2018-10-13 NOTE — Progress Notes (Signed)
D:  Patient's self inventory sheet, patient has poor sleep, sleep medication is not helpful.   Patient has fair appetite, normal energy level, good concentration.  Denied  depression and hopeless, rated anxiety 2.  Denied withdrawals.    Denied SI.  Denied physical problems.  Goal is maintaining healthy mind.  Plans to talk, work puzzles.  No discharge plan. A:  Medications administered per MD orders.  Emotional support and encouragement given patient. R:  Patient denied SI and HI, contracts for safety.  Denied A/V hallucinations.  Safety maintained with 15 minute checks.                                                   f

## 2018-10-13 NOTE — Progress Notes (Signed)
Pt observe interacting with peers in the dayroom. Pt attended wrap up group. She appears animated/anxious on approach. Pt denies SI/HI/AVH at this time. Pt requesting PRNs for sleep. Pt appears apprehensive about taking meds. Pt states she hopes to be d/c tomorrow. Support and encouragement offered. Safety maintained.

## 2018-10-13 NOTE — Progress Notes (Signed)
The patient expressed in group that she was proud of the fact that she learned how to play cards with her peers. She also indicated that she enjoyed listening to her peer play the piano. Her goal for tomorrow is to continue to play cards with her peers and to socialize.

## 2018-10-13 NOTE — Progress Notes (Cosign Needed)
Adult Psychoeducational Group Note  Date:  10/13/2018 Time:  1:15 PM  Group Topic/Focus:  Goals Group:   The focus of this group is to help patients establish daily goals to achieve during treatment and discuss how the patient can incorporate goal setting into their daily lives to aide in recovery.  Participation Level:  Active  Participation Quality:  Appropriate  Affect:  Appropriate  Cognitive:  Alert  Insight: Appropriate  Engagement in Group:  Engaged  Modes of Intervention:  Discussion  Additional Comments:  Pt attended group and participated in discussion.  Sharlotte Baka R Natahlia Hoggard 10/13/2018, 1:15 PM
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
## 2018-10-14 MED ORDER — MELATONIN 5 MG PO TABS: 5.0000 mg | ORAL_TABLET | Freq: Every evening | ORAL | 0 refills | Status: DC | PRN

## 2018-10-14 MED ORDER — HYDROXYZINE HCL 10 MG PO TABS: 10.0000 mg | ORAL_TABLET | ORAL | 0 refills | Status: DC | PRN

## 2018-10-14 MED ORDER — VENLAFAXINE HCL ER 37.5 MG PO CP24: 37.5000 mg | ORAL_CAPSULE | Freq: Every day | ORAL | 0 refills | Status: DC

## 2018-10-14 NOTE — Plan of Care (Signed)
Discharge note  Patient verbalizes readiness for discharge. Follow up plan explained, AVS, Transition record and SRA given. Prescriptions and teaching provided. Belongings returned and signed for. Suicide safety plan completed and signed. Patient verbalizes understanding. Patient denies SI/HI and assures this Probation officer he will seek assistance should that change. Patient discharged to lobby where husband was waiting.  Problem: Education: Goal: Utilization of techniques to improve thought processes will improve Outcome: Adequate for Discharge Goal: Knowledge of the prescribed therapeutic regimen will improve Outcome: Adequate for Discharge   Problem: Activity: Goal: Interest or engagement in leisure activities will improve Outcome: Adequate for Discharge Goal: Imbalance in normal sleep/wake cycle will improve Outcome: Adequate for Discharge   Problem: Coping: Goal: Coping ability will improve Outcome: Adequate for Discharge Goal: Will verbalize feelings Outcome: Adequate for Discharge   Problem: Health Behavior/Discharge Planning: Goal: Ability to make decisions will improve Outcome: Adequate for Discharge Goal: Compliance with therapeutic regimen will improve Outcome: Adequate for Discharge   Problem: Role Relationship: Goal: Will demonstrate positive changes in social behaviors and relationships Outcome: Adequate for Discharge   Problem: Safety: Goal: Ability to disclose and discuss suicidal ideas will improve Outcome: Adequate for Discharge Goal: Ability to identify and utilize support systems that promote safety will improve Outcome: Adequate for Discharge   Problem: Self-Concept: Goal: Will verbalize positive feelings about self Outcome: Adequate for Discharge Goal: Level of anxiety will decrease Outcome: Adequate for Discharge   Problem: Education: Goal: Ability to state activities that reduce stress will improve Outcome: Adequate for Discharge   Problem:  Coping: Goal: Ability to identify and develop effective coping behavior will improve Outcome: Adequate for Discharge   Problem: Self-Concept: Goal: Ability to identify factors that promote anxiety will improve Outcome: Adequate for Discharge Goal: Level of anxiety will decrease Outcome: Adequate for Discharge Goal: Ability to modify response to factors that promote anxiety will improve Outcome: Adequate for Discharge   Problem: Education: Goal: Knowledge of [facility] Education information/materials will improve Outcome: Adequate for Discharge Goal: Emotional status will improve Outcome: Adequate for Discharge Goal: Mental status will improve Outcome: Adequate for Discharge Goal: Verbalization of understanding the information provided will improve Outcome: Adequate for Discharge   Problem: Activity: Goal: Interest or engagement in activities will improve Outcome: Adequate for Discharge Goal: Sleeping patterns will improve Outcome: Adequate for Discharge   Problem: Coping: Goal: Ability to verbalize frustrations and anger appropriately will improve Outcome: Adequate for Discharge Goal: Ability to demonstrate self-control will improve Outcome: Adequate for Discharge   Problem: Health Behavior/Discharge Planning: Goal: Identification of resources available to assist in meeting health care needs will improve Outcome: Adequate for Discharge Goal: Compliance with treatment plan for underlying cause of condition will improve Outcome: Adequate for Discharge   Problem: Physical Regulation: Goal: Ability to maintain clinical measurements within normal limits will improve Outcome: Adequate for Discharge   Problem: Safety: Goal: Periods of time without injury will increase Outcome: Adequate for Discharge   Problem: Education: Goal: Ability to make informed decisions regarding treatment will improve Outcome: Adequate for Discharge   Problem: Coping: Goal: Coping  ability will improve Outcome: Adequate for Discharge   Problem: Health Behavior/Discharge Planning: Goal: Identification of resources available to assist in meeting health care needs will improve Outcome: Adequate for Discharge   Problem: Medication: Goal: Compliance with prescribed medication regimen will improve Outcome: Adequate for Discharge   Problem: Self-Concept: Goal: Ability to disclose and discuss suicidal ideas will improve Outcome: Adequate for Discharge Goal: Will verbalize positive  feelings about self Outcome: Adequate for Discharge

## 2018-10-14 NOTE — Progress Notes (Signed)
  [facility] Adult Case Management Discharge Plan :  Will you be returning to the same living situation after discharge:  Yes,  patient is returning home with her children's father and two children At discharge, do you have transportation home?: Yes,  patient reports her children's father is picking her up Do you have the ability to pay for your medications: Yes,  [employer]  Release of information consent forms completed and in the chart;  Patient's signature needed at discharge.  Patient to Follow up at: [facility] Follow up on 10/21/2018.   Why: Medication management with Dr. Loni Muse is Friday, 7/24 at 11:00a.  Please wear a mask, bring photo ID, insurance card and current medications.  Contact information: [address] [place] [address] [phone]        Progressing Through Therapy Follow up on 10/18/2018.   Why: Therapy appointment is Tuesday, 7/21 at 2:00p.  Please bring your photo ID and insurance card.  Contact information: [place] [address] Ph: [phone] Fx: [phone]           Next level of care provider has access to [place] and Suicide Prevention discussed: Yes,  with the patient     Has patient been referred to the Quitline?: N/A patient is not a smoker  Patient has been referred for addiction treatment: [facility] [place] 10/14/2018, 9:57 AM
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
## 2018-10-14 NOTE — BHH Suicide Risk Assessment (Signed)
[facility] Discharge Suicide Risk Assessment   Principal Problem: <principal problem not specified> Discharge Diagnoses: Active Problems:   MDD (major depressive disorder), recurrent episode, severe (HCC)   Major depression, recurrent ([place])   Total Time spent with patient: 15 minutes  Musculoskeletal: Strength & Muscle Tone: within normal limits Gait & Station: normal Patient leans: N/A  Psychiatric Specialty Exam: Review of Systems  All other systems reviewed and are negative.   Blood pressure 103/64, pulse 79, temperature 97.8 F (36.6 C), resp. rate 16, height 5\' 3"  (1.6 m), weight 80.7 kg, last menstrual period 09/28/2018, SpO2 99 %, currently breastfeeding.Body mass index is 31.53 kg/m.  General Appearance: Casual  Eye Contact::  Fair  Speech:  Normal Rate409  Volume:  Normal  Mood:  Euthymic  Affect:  Congruent  Thought Process:  Coherent and Descriptions of Associations: Intact  Orientation:  Full (Time, Place, and Person)  Thought Content:  Logical  Suicidal Thoughts:  No  Homicidal Thoughts:  No  Memory:  Immediate;   Fair Recent;   Fair Remote;   Fair  Judgement:  Intact  Insight:  Fair  Psychomotor Activity:  Normal  Concentration:  Fair  Recall:  Good  Fund of Knowledge:Good  Language: Fair  Akathisia:  Negative  Handed:  Right  AIMS (if indicated):     Assets:  Desire for Improvement Resilience  Sleep:  Number of Hours: 6  Cognition: WNL  ADL's:  Intact   Mental Status Per Nursing Assessment::   On Admission:  NA  Demographic Factors:  Low socioeconomic status  Loss Factors: NA  Historical Factors: Impulsivity  Risk Reduction Factors:   Positive coping skills or problem solving skills  Continued Clinical Symptoms:  Depression:   Impulsivity  Cognitive Features That Contribute To Risk:  None    Suicide Risk:  Minimal: No identifiable suicidal ideation.  Patients presenting with no risk factors but with morbid ruminations; may be  classified as minimal risk based on the severity of the depressive symptoms  [facility] Follow up on 10/21/2018.   Why: Medication management with Dr. Loni Muse is Friday, 7/24 at 11:00a.  Please wear a mask, bring photo ID, insurance card and current medications.  Contact information: [address] [place] [address] [phone]        Progressing through Therapy Follow up on 10/18/2018.   Why: Therapy appointment is Tuesday, 7/21 at 2:00p.  Please bring your photo ID and insurance card.  Contact information: [address]  [place] [address] Ph: [phone] Fx: [phone]           Plan Of Care/Follow-up recommendations:  Activity:  ad lib  Sharma Covert, MD 10/14/2018, 7:49 AM

## 2019-03-31 NOTE — L&D Delivery Note (Signed)
Delivery Note  Guided pushing with maternal urge. Delivery of a viable female at 7. Fetal head delivered in LOP position compounded by left hand. Nuchal cord: none. Infant w/ strong tone and lusty cry placed on maternal abd, dried, and tactile stim.  Cord double clamped after 2 min and cut by Father. Father present for birth. Cord blood sample collected Yes Arterial cord blood sample N/A.  Placenta delivered Schultz side, intact, with 3 VC.  Placenta to L&D. Uterine tone firm bleeding scant  No laceration identified.  Anesthesia: Epidural Repair: N/A QBL (mL): 55 Complications: none APGAR: APGAR (1 MIN): 9   APGAR (5 MINS): 9   APGAR (10 MINS):   Mom to postpartum.  Baby to Couplet care / Skin to Skin.  Roma Schanz MSN, CNM 01/03/2020, 6:07 PM

## 2019-05-22 ENCOUNTER — Other Ambulatory Visit: Payer: Self-pay

## 2019-05-22 ENCOUNTER — Ambulatory Visit (INDEPENDENT_AMBULATORY_CARE_PROVIDER_SITE_OTHER): Payer: 59

## 2019-05-22 VITALS — BP 107/70 | HR 75 | Temp 97.8°F | Ht 63.0 in | Wt 192.0 lb

## 2019-05-22 DIAGNOSIS — Z3201 Encounter for pregnancy test, result positive: Secondary | ICD-10-CM | POA: Diagnosis not present

## 2019-05-22 DIAGNOSIS — Z3480 Encounter for supervision of other normal pregnancy, unspecified trimester: Secondary | ICD-10-CM

## 2019-05-22 LAB — POCT URINE PREGNANCY: Preg Test, Ur: POSITIVE — AB

## 2019-05-22 MED ORDER — BLOOD PRESSURE KIT DEVI: 1.0000 | 0 refills | Status: DC

## 2019-05-22 NOTE — Progress Notes (Signed)
Tina [name] presents today for UPT. She has no unusual complaints.  LMP:04/09/2019    [redacted]w[redacted]d EDD 01/14/2020    OBJECTIVE: Appears well, in no apparent distress.  OB History    Gravida  4   Para  2   Term  2   Preterm  0   AB  1   Living  2     SAB  0   TAB  0   Ectopic  0   Multiple  0   Live Births  2          Home UPT Result: POSITIVE X3 In-Office UPT result:POSITIVE  I have reviewed the patient's medical, obstetrical, social, and family histories, and medications.   ASSESSMENT: Positive pregnancy test  PLAN Prenatal care to be completed at: [facility]   ---------------------------------------------------------  PRENATAL INTAKE SUMMARY  Tina [name] presents today New OB Nurse Interview.  OB History    Gravida  4   Para  2   Term  2   Preterm  0   AB  1   Living  2     SAB  0   TAB  0   Ectopic  0   Multiple  0   Live Births  2          I have reviewed the patient's medical, obstetrical, social, and family histories, medications, and available lab results.  SUBJECTIVE She has no unusual complaints  OBJECTIVE Initial Physical Exam (New OB)  GENERAL APPEARANCE: alert, well appearing   ASSESSMENT Normal pregnancy  PLAN Prenatal care @ [facility] ordered

## 2019-05-22 NOTE — Progress Notes (Signed)
Patient seen and assessed by nursing staff during this encounter. I have reviewed the chart and agree with the documentation and plan.  Catalina Antigua, MD 05/22/2019 9:30 AM

## 2019-05-25 ENCOUNTER — Encounter: Payer: Self-pay | Admitting: *Deleted

## 2019-05-31 ENCOUNTER — Other Ambulatory Visit: Payer: Self-pay

## 2019-05-31 DIAGNOSIS — Z3201 Encounter for pregnancy test, result positive: Secondary | ICD-10-CM

## 2019-06-22 ENCOUNTER — Ambulatory Visit (INDEPENDENT_AMBULATORY_CARE_PROVIDER_SITE_OTHER): Payer: 59

## 2019-06-22 ENCOUNTER — Other Ambulatory Visit: Payer: 59

## 2019-06-22 ENCOUNTER — Other Ambulatory Visit: Payer: Self-pay

## 2019-06-22 VITALS — BP 135/77 | HR 96 | Wt 193.8 lb

## 2019-06-22 DIAGNOSIS — F339 Major depressive disorder, recurrent, unspecified: Secondary | ICD-10-CM

## 2019-06-22 DIAGNOSIS — Z3A10 10 weeks gestation of pregnancy: Secondary | ICD-10-CM | POA: Diagnosis not present

## 2019-06-22 DIAGNOSIS — Z1151 Encounter for screening for human papillomavirus (HPV): Secondary | ICD-10-CM | POA: Diagnosis not present

## 2019-06-22 DIAGNOSIS — N898 Other specified noninflammatory disorders of vagina: Secondary | ICD-10-CM | POA: Diagnosis not present

## 2019-06-22 DIAGNOSIS — Z113 Encounter for screening for infections with a predominantly sexual mode of transmission: Secondary | ICD-10-CM

## 2019-06-22 DIAGNOSIS — Z3480 Encounter for supervision of other normal pregnancy, unspecified trimester: Secondary | ICD-10-CM

## 2019-06-22 DIAGNOSIS — B373 Candidiasis of vulva and vagina: Secondary | ICD-10-CM

## 2019-06-22 DIAGNOSIS — O99211 Obesity complicating pregnancy, first trimester: Secondary | ICD-10-CM | POA: Diagnosis not present

## 2019-06-22 DIAGNOSIS — O99210 Obesity complicating pregnancy, unspecified trimester: Secondary | ICD-10-CM

## 2019-06-22 DIAGNOSIS — Z124 Encounter for screening for malignant neoplasm of cervix: Secondary | ICD-10-CM | POA: Diagnosis not present

## 2019-06-22 NOTE — Patient Instructions (Signed)
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
## 2019-06-22 NOTE — Progress Notes (Signed)
NOB in office today, completed intake interview on 05-22-19. Pt declines genetic testing

## 2019-06-22 NOTE — Assessment & Plan Note (Signed)
Recommendations Arly.Keller ] Aspirin 81 mg daily after 12 weeks; discontinue after 36 weeks [ ]  Nutrition consult ] Weight gain 11-20 lbs for singleton and 25-35 lbs for twin pregnancy (IOM guidelines) . Higher class of obesity patients recommended to gain closer to lower limit  . Weight loss is associated with adverse outcomes 10-03-1993 ] Screen for DM with A1C or early 2 hr GTT Arly.Keller ] Baseline and surveillance labs (pulled in from [facility], refresh links as needed)  Lab Results  Component Value Date   PLT 261 10/11/2018   CREATININE 0.87 10/11/2018   AST 15 10/11/2018   ALT 14 10/11/2018    Antenatal Testing: Not indicated.  [ ]  Growth scans every 4-6 weeks as needed (fundal height likely inadequate in morbidly obese patients)  Postpartum Care: [ ]  Consider prophylactic wound vac/PICO for C/S [ ]  Lovenox for DVT/PE prophylaxis (6 hours after vaginal delivery, 12 hours after C/S).    Lovenox 40 mg [place] q24h (BMI 30.0-39.9 kg/m2)   Lovenox 0.5 mg/kg [place] q12h ((BMI ?40 kg/m2 ); Max 150 mg  q12h.   Consider prolonged therapy x 6 weeks PP in very concerning patients (I.e morbid obesity with other co-morbidities that increase risk of DVT/PE) [ ]  Counsel about diet, exercise and weight loss. Referrals PRN.

## 2019-06-23 LAB — OBSTETRIC PANEL, INCLUDING HIV
Antibody Screen: NEGATIVE
Basophils Absolute: 0 10*3/uL (ref 0.0–0.2)
Basos: 0 %
EOS (ABSOLUTE): 0.1 10*3/uL (ref 0.0–0.4)
Eos: 1 %
HIV Screen 4th Generation wRfx: NONREACTIVE
Hematocrit: 37.5 % (ref 34.0–46.6)
Hemoglobin: 12.2 g/dL (ref 11.1–15.9)
Hepatitis B Surface Ag: NEGATIVE
Immature Grans (Abs): 0.1 10*3/uL (ref 0.0–0.1)
Immature Granulocytes: 1 %
Lymphocytes Absolute: 1.5 10*3/uL (ref 0.7–3.1)
Lymphs: 13 %
MCH: 29.3 pg (ref 26.6–33.0)
MCHC: 32.5 g/dL (ref 31.5–35.7)
MCV: 90 fL (ref 79–97)
Monocytes Absolute: 0.6 10*3/uL (ref 0.1–0.9)
Monocytes: 5 %
Neutrophils Absolute: 9 10*3/uL — ABNORMAL HIGH (ref 1.4–7.0)
Neutrophils: 80 %
Platelets: 286 10*3/uL (ref 150–450)
RBC: 4.17 x10E6/uL (ref 3.77–5.28)
RDW: 12.4 % (ref 11.7–15.4)
RPR Ser Ql: NONREACTIVE
Rh Factor: POSITIVE
Rubella Antibodies, IGG: 5.54 index (ref >0.99)
WBC: 11.3 10*3/uL — ABNORMAL HIGH (ref 3.4–10.8)

## 2019-06-23 LAB — CERVICOVAGINAL ANCILLARY ONLY
Bacterial Vaginitis (gardnerella): NEGATIVE
Candida Glabrata: NEGATIVE
Candida Vaginitis: POSITIVE — AB
Chlamydia: NEGATIVE
Comment: NEGATIVE
Comment: NEGATIVE
Comment: NEGATIVE
Comment: NEGATIVE
Comment: NEGATIVE
Comment: NORMAL
Neisseria Gonorrhea: NEGATIVE
Trichomonas: NEGATIVE

## 2019-06-23 LAB — HEPATITIS C ANTIBODY: Hep C Virus Ab: <0.1 s/co ratio (ref 0.0–0.9)

## 2019-06-24 ENCOUNTER — Other Ambulatory Visit (HOSPITAL_COMMUNITY): Payer: Self-pay

## 2019-06-24 DIAGNOSIS — O99210 Obesity complicating pregnancy, unspecified trimester: Secondary | ICD-10-CM

## 2019-06-24 DIAGNOSIS — Z3480 Encounter for supervision of other normal pregnancy, unspecified trimester: Secondary | ICD-10-CM

## 2019-06-24 DIAGNOSIS — B3731 Acute candidiasis of vulva and vagina: Secondary | ICD-10-CM

## 2019-06-24 DIAGNOSIS — B373 Candidiasis of vulva and vagina: Secondary | ICD-10-CM

## 2019-06-24 LAB — CULTURE, OB URINE

## 2019-06-24 LAB — URINE CULTURE, OB REFLEX

## 2019-06-24 MED ORDER — TERCONAZOLE 0.8 % VA CREA: 1.0000 | TOPICAL_CREAM | Freq: Every day | VAGINAL | 0 refills | Status: DC

## 2019-06-26 LAB — HGB A1C W/O EAG: Hgb A1c MFr Bld: 5.1 % (ref 4.8–5.6)

## 2019-06-26 LAB — CYTOLOGY - PAP
Comment: NEGATIVE
Diagnosis: NEGATIVE
High risk HPV: NEGATIVE

## 2019-06-26 LAB — SPECIMEN STATUS REPORT

## 2019-06-27 LAB — COMPREHENSIVE METABOLIC PANEL
ALT: 8 IU/L (ref 0–32)
AST: 11 IU/L (ref 0–40)
Albumin/Globulin Ratio: 1.5 (ref 1.2–2.2)
Albumin: 3.7 g/dL — ABNORMAL LOW (ref 3.9–5.0)
Alkaline Phosphatase: 57 IU/L (ref 39–117)
BUN/Creatinine Ratio: 9 (ref 9–23)
BUN: 6 mg/dL (ref 6–20)
Bilirubin Total: <0.2 mg/dL (ref 0.0–1.2)
CO2: 19 mmol/L — ABNORMAL LOW (ref 20–29)
Calcium: 9.3 mg/dL (ref 8.7–10.2)
Chloride: 104 mmol/L (ref 96–106)
Creatinine, Ser: 0.68 mg/dL (ref 0.57–1.00)
GFR calc Af Amer: 136 mL/min/{1.73_m2} (ref >59)
GFR calc non Af Amer: 118 mL/min/{1.73_m2} (ref >59)
Globulin, Total: 2.4 g/dL (ref 1.5–4.5)
Glucose: 76 mg/dL (ref 65–99)
Potassium: 3.9 mmol/L (ref 3.5–5.2)
Sodium: 136 mmol/L (ref 134–144)
Total Protein: 6.1 g/dL (ref 6.0–8.5)

## 2019-06-27 LAB — SPECIMEN STATUS REPORT

## 2019-07-06 ENCOUNTER — Telehealth: Payer: Self-pay

## 2019-07-06 NOTE — Telephone Encounter (Signed)
Elen Acero is a 29 y.o. year old [id number] with LMP 04/09/19 per patient which would correlate to [redacted]w[redacted]d weeks gestation.  She has regular menstrual cycles.   She is here today for a confirmatory initial sonogram.    GESTATION: [redacted]w[redacted]d SINGLETON    FETAL ACTIVITY:          Heart rate         149          The fetus is active.   GESTATIONAL AGE AND  BIOMETRICS:  Gestational criteria: Estimated Date of Delivery: 12/27/19 by early ultrasound now at [redacted]w[redacted]d  Previous Scans:0  GESTATIONAL SAC    CROWN RUMP LENGTH [redacted]w[redacted]d                                                                                AVERAGE EGA(BY THIS SCAN):  13 weeks 1d  WORKING 12/27/19( early ultrasound ): Completed on 06/22/19     TECHNICIAN COMMENTS:  Patient dating today was [redacted]w[redacted]d by [facility] and CR[facility].   A copy of this report including all images has been saved and backed up to a second source for retrieval if needed. All measures and details of the anatomical scan, placentation, fluid volume and pelvic anatomy are contained in that report.  Galaxy Borden J Lafawn Lenoir 06/22/19 10:02 AM

## 2019-07-06 NOTE — Progress Notes (Signed)
DATING AND VIABILITY SONOGRAM   Tina [name] is a 29 y.o. year old [id number] with LMP 04/09/19 per patient which would correlate to [redacted]w[redacted]d weeks gestation.  She has regular menstrual cycles.   She is here today for a confirmatory initial sonogram.    GESTATION: [redacted]w[redacted]d SINGLETON    FETAL ACTIVITY:          Heart rate         149          The fetus is active.   Gestational criteria: Estimated Date of Delivery: 12/27/19 by early ultrasound now at [redacted]w[redacted]d  Previous Scans:0  GESTATIONAL SAC                    CROWN RUMP LENGTH                   13 weeks                                                                               AVERAGE EGA(BY THIS SCAN):  13 weeks  WORKING 12/27/19( early ultrasound ): Completed on 06/22/19     TECHNICIAN COMMENTS:  Scanned 06/22/19.Patient dating today was [redacted]w[redacted]d by [facility] and [facility]. Viable fetus   A copy of this report including all images has been saved and backed up to a second source for retrieval if needed. All measures and details of the anatomical scan, placentation, fluid volume and pelvic anatomy are contained in that report.  Tina [name] J [name] 06/22/2019 10:02 AM

## 2019-07-06 NOTE — Progress Notes (Unsigned)
DATING AND VIABILITY SONOGRAM   Peytyn Trine is a 29 y.o. year old [id number] with LMP 04/09/19 per patient which would correlate to [redacted]w[redacted]d weeks gestation.  She has regular menstrual cycles.   She is here today for a confirmatory initial sonogram.    GESTATION: [redacted]w[redacted]d SINGLETON    FETAL ACTIVITY:          Heart rate         149          The fetus is active.  PLACENTA LOCALIZATION:  {ob gyn placenta location:312808} GRADE {NUMBERS 0-12:18577}  CERVIX: Measures *** cm  ADNEXA: The ovaries are {Normal/Abnormal Appearance:21344:o:"normal"}.***   GESTATIONAL AGE AND  BIOMETRICS:  Gestational criteria: Estimated Date of Delivery: 12/27/19 by early ultrasound now at [redacted]w[redacted]d  Previous Scans:{NUMBERS 0-12:18577}  GESTATIONAL SAC           *** mm         *** weeks  CROWN RUMP LENGTH           *** mm         13 weeks                                                                               AVERAGE EGA(BY THIS SCAN):  *** weeks  WORKING 12/27/19( early ultrasound ): Completed on 06/22/19     TECHNICIAN COMMENTS:  Patient dating today was [redacted]w[redacted]d by [facility] and [facility].   A copy of this report including all images has been saved and backed up to a second source for retrieval if needed. All measures and details of the anatomical scan, placentation, fluid volume and pelvic anatomy are contained in that report.  Kathrene Sinopoli J Karrin Eisenmenger 06/22/2019 10:02 AM

## 2019-08-03 ENCOUNTER — Telehealth (INDEPENDENT_AMBULATORY_CARE_PROVIDER_SITE_OTHER): Payer: 59 | Admitting: Nurse Practitioner

## 2019-08-03 ENCOUNTER — Telehealth: Payer: Self-pay

## 2019-08-03 DIAGNOSIS — Z5329 Procedure and treatment not carried out because of patient's decision for other reasons: Secondary | ICD-10-CM

## 2019-08-03 NOTE — Telephone Encounter (Addendum)
Unable to reach pt for virtual visit today VM not set up

## 2019-08-03 NOTE — Progress Notes (Signed)
Patient did not answer the phone for her virtual visit today. No visit done.  Nolene Bernheim, RN, MSN, NP-BC Nurse Practitioner, [facility] for [employer], [facility] 08/03/2019 4:35 PM

## 2019-08-16 ENCOUNTER — Other Ambulatory Visit: Payer: Self-pay

## 2019-08-16 ENCOUNTER — Ambulatory Visit: Payer: 59 | Attending: Obstetrics and Gynecology

## 2019-08-16 ENCOUNTER — Other Ambulatory Visit: Payer: Self-pay | Admitting: Internal Medicine

## 2019-08-16 ENCOUNTER — Other Ambulatory Visit (HOSPITAL_COMMUNITY): Payer: Self-pay

## 2019-08-16 ENCOUNTER — Other Ambulatory Visit: Payer: Self-pay | Admitting: *Deleted

## 2019-08-16 DIAGNOSIS — Z362 Encounter for other antenatal screening follow-up: Secondary | ICD-10-CM

## 2019-08-16 DIAGNOSIS — Z3A21 21 weeks gestation of pregnancy: Secondary | ICD-10-CM

## 2019-08-16 DIAGNOSIS — Z3689 Encounter for other specified antenatal screening: Secondary | ICD-10-CM | POA: Diagnosis not present

## 2019-08-16 DIAGNOSIS — Z363 Encounter for antenatal screening for malformations: Secondary | ICD-10-CM | POA: Diagnosis not present

## 2019-08-31 ENCOUNTER — Telehealth: Payer: 59 | Admitting: Obstetrics and Gynecology

## 2019-09-13 ENCOUNTER — Other Ambulatory Visit: Payer: Self-pay

## 2019-09-13 ENCOUNTER — Ambulatory Visit: Payer: 59 | Attending: Obstetrics and Gynecology

## 2019-09-13 DIAGNOSIS — Z3A25 25 weeks gestation of pregnancy: Secondary | ICD-10-CM | POA: Diagnosis not present

## 2019-09-13 DIAGNOSIS — Z362 Encounter for other antenatal screening follow-up: Secondary | ICD-10-CM | POA: Diagnosis present

## 2019-11-28 LAB — OB RESULTS CONSOLE GBS: GBS: NEGATIVE

## 2019-11-30 ENCOUNTER — Encounter (HOSPITAL_COMMUNITY): Payer: Self-pay | Admitting: *Deleted

## 2019-11-30 ENCOUNTER — Other Ambulatory Visit (HOSPITAL_COMMUNITY)
Admission: RE | Admit: 2019-11-30 | Discharge: 2019-11-30 | Disposition: A | Discharge status: Home / Self Care | Payer: 59 | Source: Ambulatory Visit | Attending: Family Medicine | Admitting: Family Medicine

## 2019-11-30 ENCOUNTER — Other Ambulatory Visit: Payer: Self-pay | Admitting: Obstetrics and Gynecology

## 2019-11-30 ENCOUNTER — Telehealth (HOSPITAL_COMMUNITY): Payer: Self-pay | Admitting: *Deleted

## 2019-11-30 DIAGNOSIS — Z20822 Contact with and (suspected) exposure to covid-19: Secondary | ICD-10-CM | POA: Insufficient documentation

## 2019-11-30 DIAGNOSIS — Z01818 Encounter for other preprocedural examination: Secondary | ICD-10-CM | POA: Insufficient documentation

## 2019-11-30 LAB — SARS CORONAVIRUS 2 (TAT 6-24 HRS): SARS Coronavirus 2: NEGATIVE

## 2019-11-30 NOTE — Telephone Encounter (Signed)
Preadmission screen  

## 2019-12-01 ENCOUNTER — Encounter (HOSPITAL_COMMUNITY): Payer: Self-pay

## 2019-12-01 ENCOUNTER — Other Ambulatory Visit: Payer: Self-pay

## 2019-12-01 ENCOUNTER — Inpatient Hospital Stay (HOSPITAL_COMMUNITY)
Admission: AD | Admit: 2019-12-01 | Discharge: 2019-12-01 | DRG: 833 | Disposition: A | Discharge status: Home / Self Care | Payer: 59 | Attending: Obstetrics and Gynecology | Admitting: Obstetrics and Gynecology

## 2019-12-01 DIAGNOSIS — Z3A36 36 weeks gestation of pregnancy: Secondary | ICD-10-CM

## 2019-12-01 DIAGNOSIS — O322XX1 Maternal care for transverse and oblique lie, fetus 1: Secondary | ICD-10-CM

## 2019-12-01 DIAGNOSIS — Z20822 Contact with and (suspected) exposure to covid-19: Secondary | ICD-10-CM | POA: Diagnosis present

## 2019-12-01 DIAGNOSIS — O322XX0 Maternal care for transverse and oblique lie, not applicable or unspecified: Secondary | ICD-10-CM | POA: Diagnosis present

## 2019-12-01 MED ORDER — TERBUTALINE SULFATE 1 MG/ML IJ SOLN: 0.2500 mg | Freq: Once | INTRAMUSCULAR | Status: DC | PRN

## 2019-12-01 MED ORDER — TERBUTALINE SULFATE 1 MG/ML IJ SOLN: 0.2500 mg | Freq: Once | INTRAMUSCULAR | Status: DC

## 2019-12-01 MED ORDER — LACTATED RINGERS IV SOLN: INTRAVENOUS | Status: DC

## 2019-12-01 MED ORDER — TERBUTALINE SULFATE 1 MG/ML IJ SOLN
INTRAMUSCULAR | Status: AC
  Filled 2019-12-01: qty 1

## 2019-12-01 NOTE — Op Note (Signed)
After informed verbal consent cat 1 NST no contacitons. , ECV was attempted under Ultrasound guidance.  Infant TVS head on maternal right.   FHR was reactive before and after the procedure.   Pt. Tolerated the procedure well. infnat was converted to VTX confirmed with [place]

## 2019-12-01 NOTE — H&P (Signed)
Tina [name] is a 29 y.o. female presenting for ECV.  On [place] baby was TVS with head to maternal right, fundal placenta and AFI of 13.  Pt is without complaint. OB History    Gravida  4   Para  2   Term  2   Preterm  0   AB  1   Living  2     SAB  0   TAB  0   Ectopic  0   Multiple  0   Live Births  2          Past Medical History:  Diagnosis Date  . Anemia   . Anxiety   . Depression   . Pregnant   . Tinnitus    Past Surgical History:  Procedure Laterality Date  . DILATION AND CURETTAGE OF UTERUS    . nasal cyst removal    . right arm surgery     Rod and screws placed   Family History: family history includes Cancer in her mother; Diabetes in her father; Hypertension in her father; Obesity in her mother. Social History:  reports that she has never smoked. She has never used smokeless tobacco. She reports that she does not drink alcohol and does not use drugs.     Maternal Diabetes: No Genetic Screening: Normal Maternal Ultrasounds/Referrals: Normal Fetal Ultrasounds or other Referrals:  None Maternal Substance Abuse:  No Significant Maternal Medications:  None Significant Maternal Lab Results:  None Other Comments:  None  Review of Systems History   Blood pressure (!) 131/52, pulse 89, temperature 97.6 F (36.4 C), temperature source Oral, resp. rate 18, height 5\' 3"  (1.6 m), weight 98.4 kg, last menstrual period 04/09/2019. Exam Physical Exam  Physical Examination: General appearance - alert, well appearing, and in no distress Heart - normal rate and regular rhythm Abdomen - soft, nontender, nondistended, no masses or organomegaly gravid Extremities - peripheral pulses normal, no pedal edema, no clubbing or cyanosis, Homan's sign negative bilaterally Prenatal labs: ABO, Rh: O/Positive/-- (03/25 1013) Antibody: Negative (03/25 1013) Rubella: 5.54 (03/25 1013) RPR: Non Reactive (03/25 1013)  HBsAg: Negative (03/25 1013)  HIV: Non Reactive  (03/25 1013)  GBS:   unkonwn  Assessment/Plan: Transverse presentation at 36 weeks. Pt presents for ECV R&B reviewed with her and her husband to be but not limited to bleeding, ROM, premature labor and bodily injury to mom or fetus.  Pt and her husband understand and agree to procedure   Ruven Corradi A Leeandre Nordling 12/01/2019, 8:52 AM

## 2019-12-27 ENCOUNTER — Encounter (HOSPITAL_COMMUNITY): Payer: Self-pay | Admitting: *Deleted

## 2019-12-27 ENCOUNTER — Other Ambulatory Visit: Payer: Self-pay | Admitting: Obstetrics and Gynecology

## 2019-12-27 ENCOUNTER — Telehealth (HOSPITAL_COMMUNITY): Payer: Self-pay | Admitting: *Deleted

## 2019-12-27 NOTE — Telephone Encounter (Signed)
Preadmission screen  

## 2020-01-01 ENCOUNTER — Other Ambulatory Visit (HOSPITAL_COMMUNITY)
Admission: RE | Admit: 2020-01-01 | Discharge: 2020-01-01 | Disposition: A | Discharge status: Home / Self Care | Payer: 59 | Source: Ambulatory Visit | Attending: Obstetrics and Gynecology | Admitting: Obstetrics and Gynecology

## 2020-01-01 DIAGNOSIS — Z01812 Encounter for preprocedural laboratory examination: Secondary | ICD-10-CM | POA: Insufficient documentation

## 2020-01-01 DIAGNOSIS — Z20822 Contact with and (suspected) exposure to covid-19: Secondary | ICD-10-CM | POA: Insufficient documentation

## 2020-01-01 LAB — SARS CORONAVIRUS 2 (TAT 6-24 HRS): SARS Coronavirus 2: NEGATIVE

## 2020-01-02 ENCOUNTER — Other Ambulatory Visit: Payer: Self-pay

## 2020-01-02 ENCOUNTER — Encounter (HOSPITAL_COMMUNITY): Payer: Self-pay | Admitting: Obstetrics and Gynecology

## 2020-01-02 ENCOUNTER — Inpatient Hospital Stay (HOSPITAL_COMMUNITY)
Admission: AD | Admit: 2020-01-02 | Discharge: 2020-01-05 | DRG: 807 | Disposition: A | Discharge status: Home / Self Care | Payer: 59 | Attending: Obstetrics & Gynecology | Admitting: Obstetrics & Gynecology

## 2020-01-02 ENCOUNTER — Inpatient Hospital Stay (HOSPITAL_COMMUNITY): Payer: 59

## 2020-01-02 DIAGNOSIS — F332 Major depressive disorder, recurrent severe without psychotic features: Secondary | ICD-10-CM | POA: Diagnosis present

## 2020-01-02 DIAGNOSIS — D509 Iron deficiency anemia, unspecified: Secondary | ICD-10-CM | POA: Diagnosis present

## 2020-01-02 DIAGNOSIS — Z01812 Encounter for preprocedural laboratory examination: Secondary | ICD-10-CM | POA: Diagnosis not present

## 2020-01-02 DIAGNOSIS — O99214 Obesity complicating childbirth: Secondary | ICD-10-CM | POA: Diagnosis present

## 2020-01-02 DIAGNOSIS — Z20822 Contact with and (suspected) exposure to covid-19: Secondary | ICD-10-CM | POA: Diagnosis present

## 2020-01-02 DIAGNOSIS — E669 Obesity, unspecified: Secondary | ICD-10-CM | POA: Diagnosis present

## 2020-01-02 DIAGNOSIS — O9902 Anemia complicating childbirth: Secondary | ICD-10-CM | POA: Diagnosis present

## 2020-01-02 DIAGNOSIS — O322XX0 Maternal care for transverse and oblique lie, not applicable or unspecified: Secondary | ICD-10-CM | POA: Diagnosis present

## 2020-01-02 DIAGNOSIS — Z3A41 41 weeks gestation of pregnancy: Secondary | ICD-10-CM | POA: Diagnosis not present

## 2020-01-02 DIAGNOSIS — Z3490 Encounter for supervision of normal pregnancy, unspecified, unspecified trimester: Secondary | ICD-10-CM | POA: Diagnosis present

## 2020-01-02 DIAGNOSIS — O480 Post-term pregnancy: Principal | ICD-10-CM | POA: Diagnosis present

## 2020-01-02 LAB — CBC
HCT: 30.5 % — ABNORMAL LOW (ref 36.0–46.0)
Hemoglobin: 9.4 g/dL — ABNORMAL LOW (ref 12.0–15.0)
MCH: 24.1 pg — ABNORMAL LOW (ref 26.0–34.0)
MCHC: 30.8 g/dL (ref 30.0–36.0)
MCV: 78.2 fL — ABNORMAL LOW (ref 80.0–100.0)
Platelets: 285 10*3/uL (ref 150–400)
RBC: 3.9 MIL/uL (ref 3.87–5.11)
RDW: 16.1 % — ABNORMAL HIGH (ref 11.5–15.5)
WBC: 11.2 10*3/uL — ABNORMAL HIGH (ref 4.0–10.5)
nRBC: 0 % (ref 0.0–0.2)

## 2020-01-02 LAB — TYPE AND SCREEN
ABO/RH(D): O POS
Antibody Screen: NEGATIVE

## 2020-01-02 MED ORDER — EPHEDRINE 5 MG/ML INJ: 10.0000 mg | INTRAVENOUS | Status: DC | PRN

## 2020-01-02 MED ORDER — MISOPROSTOL 25 MCG QUARTER TABLET
ORAL_TABLET | ORAL | Status: AC
  Filled 2020-01-02: qty 1

## 2020-01-02 MED ORDER — SOD CITRATE-CITRIC ACID 500-334 MG/5ML PO SOLN
30.0000 mL | ORAL | Status: DC | PRN
  Administered 2020-01-03: 30 mL via ORAL
  Filled 2020-01-02: qty 15

## 2020-01-02 MED ORDER — LACTATED RINGERS IV SOLN: 500.0000 mL | INTRAVENOUS | Status: DC | PRN

## 2020-01-02 MED ORDER — MISOPROSTOL 100 MCG PO TABS
25.0000 ug | ORAL_TABLET | ORAL | Status: DC
  Administered 2020-01-02 (×2): 25 ug via VAGINAL
  Filled 2020-01-02: qty 1

## 2020-01-02 MED ORDER — PHENYLEPHRINE 40 MCG/ML (10ML) SYRINGE FOR IV PUSH (FOR BLOOD PRESSURE SUPPORT): 80.0000 ug | PREFILLED_SYRINGE | INTRAVENOUS | Status: DC | PRN

## 2020-01-02 MED ORDER — FENTANYL CITRATE (PF) 100 MCG/2ML IJ SOLN
50.0000 ug | INTRAMUSCULAR | Status: DC | PRN
  Administered 2020-01-02: 50 ug via INTRAVENOUS
  Administered 2020-01-03: 100 ug via INTRAVENOUS
  Filled 2020-01-02 (×2): qty 2

## 2020-01-02 MED ORDER — OXYCODONE-ACETAMINOPHEN 5-325 MG PO TABS: 2.0000 | ORAL_TABLET | ORAL | Status: DC | PRN

## 2020-01-02 MED ORDER — TERBUTALINE SULFATE 1 MG/ML IJ SOLN: 0.2500 mg | Freq: Once | INTRAMUSCULAR | Status: DC | PRN

## 2020-01-02 MED ORDER — OXYTOCIN BOLUS FROM INFUSION
333.0000 mL | Freq: Once | INTRAVENOUS | Status: AC
  Administered 2020-01-03: 333 mL via INTRAVENOUS

## 2020-01-02 MED ORDER — LACTATED RINGERS IV SOLN
INTRAVENOUS | Status: DC
  Administered 2020-01-03: 125 mL via INTRAVENOUS

## 2020-01-02 MED ORDER — OXYTOCIN-SODIUM CHLORIDE 30-0.9 UT/500ML-% IV SOLN
2.5000 [IU]/h | INTRAVENOUS | Status: DC
  Administered 2020-01-03: 2.5 [IU]/h via INTRAVENOUS

## 2020-01-02 MED ORDER — LIDOCAINE HCL (PF) 1 % IJ SOLN: 30.0000 mL | INTRAMUSCULAR | Status: DC | PRN

## 2020-01-02 MED ORDER — LACTATED RINGERS IV SOLN: 500.0000 mL | Freq: Once | INTRAVENOUS | Status: DC

## 2020-01-02 MED ORDER — DIPHENHYDRAMINE HCL 50 MG/ML IJ SOLN: 12.5000 mg | INTRAMUSCULAR | Status: DC | PRN

## 2020-01-02 MED ORDER — ACETAMINOPHEN 325 MG PO TABS: 650.0000 mg | ORAL_TABLET | ORAL | Status: DC | PRN

## 2020-01-02 MED ORDER — FENTANYL-BUPIVACAINE-NACL 0.5-0.125-0.9 MG/250ML-% EP SOLN
12.0000 mL/h | EPIDURAL | Status: DC | PRN
  Filled 2020-01-02: qty 250

## 2020-01-02 MED ORDER — ONDANSETRON HCL 4 MG/2ML IJ SOLN: 4.0000 mg | Freq: Four times a day (QID) | INTRAMUSCULAR | Status: DC | PRN

## 2020-01-02 MED ORDER — OXYTOCIN-SODIUM CHLORIDE 30-0.9 UT/500ML-% IV SOLN
1.0000 m[IU]/min | INTRAVENOUS | Status: DC
  Administered 2020-01-03: 1 m[IU]/min via INTRAVENOUS
  Filled 2020-01-02: qty 500

## 2020-01-02 MED ORDER — OXYCODONE-ACETAMINOPHEN 5-325 MG PO TABS: 1.0000 | ORAL_TABLET | ORAL | Status: DC | PRN

## 2020-01-02 NOTE — Progress Notes (Signed)
Labor Progress Note  Tarina Volk is a 29 y.o. female, [id number], IUP at 13 weeks, presenting for IOL for late term. Pt endorse + Fm. Denies vaginal leakage. Denies vaginal bleeding. Denies feeling cxt's. PNC with h/o anemia 9.9 at 28 weeks on iron. H/O anxiety and depression no meds. Late entry to [facility] at 14 weeks. Right arm has rods and screws in place. GBS-. Baby Female.  Subjective: Pt in bed in NAD feeling cxt but tolerating well. Partner at bedside and supportive. Pt encourage to ambulating and use birthing ball, walks the halls.  Patient Active Problem List   Diagnosis Date Noted  . Encounter for induction of labor 01/02/2020  . Transverse lie of fetus 12/01/2019  . Obesity in pregnancy 06/22/2019  . Supervision of other normal pregnancy, antepartum 05/22/2019  . Major depression, recurrent (HCC) 10/12/2018  . MDD (major depressive disorder), recurrent episode, severe (HCC) 10/11/2018   Objective: BP 137/72   Pulse (!) 115   Temp 98.6 F (37 C) (Oral)   Resp 16   Ht 5' 2.5" (1.588 m)   Wt 98.6 kg   LMP 04/09/2019 (Exact Date)   BMI 39.11 kg/m  No intake/output data recorded. No intake/output data recorded. NST: FHR baseline 150 bpm, Variability: moderate, Accelerations:present, Decelerations:  Absent= Cat 1/Reactive CTX:  irregular, every 2-4 minutes, lasting 60 seconds Uterus gravid, soft non tender, mild to palpate with contractions.  SVE:  Dilation: 1 Effacement (%): 20 Station: Ballotable Exam by:: J Arlon Bleier CNM Pitocin at (N/A) mUn/min  Assessment:  Jillyn Stacey is a 29 y.o. female, [id number], IUP at 41 weeks, presenting for IOL for late term. Pt endorse + Fm. Denies vaginal leakage. Denies vaginal bleeding. Denies feeling cxt's. PNC with h/o anemia 9.9 at 28 weeks on iron. H/O anxiety and depression no meds. Late entry to [facility] at 14 weeks. Right arm has rods and screws in place. GBS-. Baby Female. Currently progressing in early labor at 1cm.  Patient Active Problem  List   Diagnosis Date Noted  . Encounter for induction of labor 01/02/2020  . Transverse lie of fetus 12/01/2019  . Obesity in pregnancy 06/22/2019  . Supervision of other normal pregnancy, antepartum 05/22/2019  . Major depression, recurrent (HCC) 10/12/2018  . MDD (major depressive disorder), recurrent episode, severe (HCC) 10/11/2018   NICHD: Category 1  Membranes:  Intact, no s/s of infection  Induction:    Cytotec x2 @ 1146, 1528  Foley Bulb: Not yet attempted, cervix posterior  Pitocin - N/A  Pain management:               IV pain management: xPRN Fenatanyl             Epidural placement: PRN  GBS Negative  Plan: Continue labor plan Continuous/intermittent monitoring Rest Ambulate Frequent position changes to facilitate fetal rotation and descent. Will reassess with cervical exam at 1930 or earlier if necessary Continue cytotec per protocol Anticipate foley bulb placement with pitocin at 1930 Anticipate labor progression and vaginal delivery.  [place], NP-C, CNM, MSN 01/02/2020. 4:05 PM

## 2020-01-02 NOTE — H&P (Signed)
Tina [name] is a 29 y.o. female, [id number], IUP at 64 weeks, presenting for IOL for late term. Pt endorse + Fm. Denies vaginal leakage. Denies vaginal bleeding. Denies feeling cxt's. [place] with h/o anemia 9.9 at 28 weeks on iron. H/O anxiety and depression no meds. Late entry to [facility] at 14 weeks. Right arm has rods and screws in place. GBS-. Baby Female.  Patient Active Problem List   Diagnosis Date Noted  . Encounter for induction of labor 01/02/2020  . Transverse lie of fetus 12/01/2019  . Obesity in pregnancy 06/22/2019  . Supervision of other normal pregnancy, antepartum 05/22/2019  . Major depression, recurrent ([place]) 10/12/2018  . MDD (major depressive disorder), recurrent episode, severe ([place]) 10/11/2018     Medications Prior to Admission  Medication Sig Dispense Refill Last Dose  . ferrous sulfate 325 (65 FE) MG tablet Take 325 mg by mouth daily with breakfast.     . Blood Pressure Monitoring (BLOOD PRESSURE KIT) DEVI 1 kit by Does not apply route once a week. Check Blood Pressure regularly and record readings into the Babyscripts App.  Large Cuff.  DX O90.0 1 each 0   . hydrOXYzine (ATARAX/VISTARIL) 10 MG tablet Take 1 tablet (10 mg total) by mouth every 4 (four) hours as needed for anxiety. (Patient not taking: Reported on 05/22/2019) 30 tablet 0   . Melatonin 5 MG TABS Take 1 tablet (5 mg total) by mouth at bedtime as needed (for sleep). (Patient not taking: Reported on 05/22/2019) 30 tablet 0   . Prenatal Vit-Fe Fumarate-FA (MULTIVITAMIN-PRENATAL) 27-0.8 MG TABS tablet Take 1 tablet by mouth daily at 12 noon.     Marland Kitchen terconazole (TERAZOL 3) 0.8 % vaginal cream Place 1 applicator vaginally at bedtime. 20 g 0   . venlafaxine XR (EFFEXOR-XR) 37.5 MG 24 hr capsule Take 1 capsule (37.5 mg total) by mouth daily with breakfast. (Patient not taking: Reported on 06/22/2019) 30 capsule 0     Past Medical History:  Diagnosis Date  . Anemia   . Anxiety   . Depression   . Pregnant   . Tinnitus       No current facility-administered medications on file prior to encounter.   Current Outpatient Medications on File Prior to Encounter  Medication Sig Dispense Refill  . ferrous sulfate 325 (65 FE) MG tablet Take 325 mg by mouth daily with breakfast.    . Blood Pressure Monitoring (BLOOD PRESSURE KIT) DEVI 1 kit by Does not apply route once a week. Check Blood Pressure regularly and record readings into the Babyscripts App.  Large Cuff.  DX O90.0 1 each 0  . hydrOXYzine (ATARAX/VISTARIL) 10 MG tablet Take 1 tablet (10 mg total) by mouth every 4 (four) hours as needed for anxiety. (Patient not taking: Reported on 05/22/2019) 30 tablet 0  . Melatonin 5 MG TABS Take 1 tablet (5 mg total) by mouth at bedtime as needed (for sleep). (Patient not taking: Reported on 05/22/2019) 30 tablet 0  . Prenatal Vit-Fe Fumarate-FA (MULTIVITAMIN-PRENATAL) 27-0.8 MG TABS tablet Take 1 tablet by mouth daily at 12 noon.    Marland Kitchen terconazole (TERAZOL 3) 0.8 % vaginal cream Place 1 applicator vaginally at bedtime. 20 g 0  . venlafaxine XR (EFFEXOR-XR) 37.5 MG 24 hr capsule Take 1 capsule (37.5 mg total) by mouth daily with breakfast. (Patient not taking: Reported on 06/22/2019) 30 capsule 0     No Known Allergies  History of present pregnancy: Pt Info/Preference:  Screening/Consents:  Labs:   EDD:  Estimated Date of Delivery: 12/26/19  Establised: Patient's last menstrual period was 04/09/2019 (exact date).  Anatomy Scan: Date: 5/19-normal Placenta Location: funal Genetic Screen: [place]:Declined AFP:  First Tri: Quad:  Office: [facility]            First PNV: 17.1 wg Blood Type --/--/PENDING (10/05 1100)  Language: english Last PNV: 40 wg Rhogam    Flu Vaccine:  declined   Antibody PENDING (10/05 1100)  TDaP vaccine declined   GTT: Early: 5.1 hga1c Third Trimester: 96  Feeding Plan: Breast/bottle BTL: no Rubella: 5.54 (03/25 1013)  Contraception: ??? VBAC: no RPR: Non Reactive (03/25 1013)   Circumcision: BG   HBsAg:  Negative (03/25 1013)  Pediatrician:  ???   HIV: Non Reactive (03/25 1013)   Prenatal Classes: no Additional [place]: 9/10 BPP 8/8 GBS:  Negative(For PCN allergy, check sensitivities)       Chlamydia: neg    MFM Referral/Consult:  GC: neg  Support Person: partener   PAP: 2021-normal  Pain Management: epidural Neonatologist Referral:  Hgb Electrophoresis:  AA  Birth Plan: [place]   Hgb NOB: 12.2    28W: 9.9   OB History    Gravida  4   Para  2   Term  2   Preterm  0   AB  1   Living  2     SAB  0   TAB  0   Ectopic  0   Multiple  0   Live Births  2          Past Medical History:  Diagnosis Date  . Anemia   . Anxiety   . Depression   . Pregnant   . Tinnitus    Past Surgical History:  Procedure Laterality Date  . DILATION AND CURETTAGE OF UTERUS    . nasal cyst removal    . right arm surgery     Rod and screws placed   Family History: family history includes Cancer in her mother; Diabetes in her father; Hypertension in her father; Obesity in her mother. Social History:  reports that she has never smoked. She has never used smokeless tobacco. She reports that she does not drink alcohol and does not use drugs.   Prenatal Transfer Tool  Maternal Diabetes: No Genetic Screening: Declined Maternal Ultrasounds/Referrals: Normal Fetal Ultrasounds or other Referrals:  None Maternal Substance Abuse:  No Significant Maternal Medications:  None Significant Maternal Lab Results: Group B Strep negative  ROS:  Review of Systems  Constitutional: Negative.   HENT: Negative.   Eyes: Negative.   Respiratory: Negative.   Cardiovascular: Negative.   Gastrointestinal: Negative.   Genitourinary: Negative.   Musculoskeletal: Negative.   Skin: Negative.   Neurological: Negative.   Endo/Heme/Allergies: Negative.   Psychiatric/Behavioral: Negative.      Physical Exam: BP 137/72   Pulse (!) 115   Temp 98.6 F (37 C) (Oral)   Resp 16   Ht 5' 2.5" (1.588 m)   Wt 98.6 kg    LMP 04/09/2019 (Exact Date)   BMI 39.11 kg/m   Physical Exam Vitals and nursing note reviewed. Exam conducted with a chaperone present.  HENT:     Head: Normocephalic and atraumatic.     Nose: Nose normal.     Mouth/Throat:     Mouth: Mucous membranes are moist.  Eyes:     Pupils: Pupils are equal, round, and reactive to light.  Cardiovascular:     Rate and Rhythm: Normal rate and regular  rhythm.     Pulses: Normal pulses.     Heart sounds: Normal heart sounds.  Pulmonary:     Effort: Pulmonary effort is normal.     Breath sounds: Normal breath sounds.  Abdominal:     General: Bowel sounds are normal.     Palpations: Abdomen is soft.  Genitourinary:    Comments: Pelvis adequate for vaginal delivery. Uterus soft.  Musculoskeletal:        General: Normal range of motion.     Cervical back: Normal range of motion and neck supple.  Skin:    General: Skin is warm.     Capillary Refill: Capillary refill takes less than 2 seconds.  Neurological:     General: No focal deficit present.     Mental Status: She is alert.  Psychiatric:        Mood and Affect: Mood normal.      NST: FHR baseline 140 bpm, Variability: moderate, Accelerations:present, Decelerations:  Absent= Cat 1/Reactive UC:   UI, occ cxt SVE:  Dilation: Closed Effacement (%): 20 Station: Ballotable Exam by:: [facility] CNM, vertex verified by fetal sutures.  Leopold's: Position vertex, EFW 8-8.5lbs via leopold's.  Pelvis proven to 7.15lbs  Labs: Results for orders placed or performed during the hospital encounter of 01/02/20 (from the past 24 hour(s))  Type and screen [place]     Status: None (Preliminary result)   Collection Time: 01/02/20 11:00 AM  Result Value Ref Range   ABO/RH(D) PENDING    Antibody Screen PENDING    Sample Expiration      01/05/2020,2359 Performed at [facility], [place] [address], [place], [place] [address]   CBC     Status: Abnormal   Collection Time:  01/02/20 11:30 AM  Result Value Ref Range   WBC 11.2 (H) 4.0 - 10.5 K/uL   RBC 3.90 3.87 - 5.11 MIL/uL   Hemoglobin 9.4 (L) 12.0 - 15.0 g/dL   HCT 30.5 (L) 36 - 46 %   MCV 78.2 (L) 80.0 - 100.0 fL   MCH 24.1 (L) 26.0 - 34.0 pg   MCHC 30.8 30.0 - 36.0 g/dL   RDW 16.1 (H) 11.5 - 15.5 %   Platelets 285 150 - 400 K/uL   nRBC 0.0 0.0 - 0.2 %    Imaging:  No results found.  MAU Course: Orders Placed This Encounter  Procedures  . CBC  . RPR  . Diet regular Room service appropriate? Yes; Fluid consistency: Thin  . Vitals signs per unit policy  . Notify Physician  . Fetal monitoring per unit policy  . Activity as tolerated  . Measure blood pressure post delivery every 15 min x 1 hour then every 30 min x 1 hour  . Fundal check post delivery every 15 min x 1 hour then every 30 min x 1 hour  . If Rapid HIV test positive or known HIV positive: initiate AZT orders  . May in and out cath x 2 for inability to void  . Insert foley catheter  . Discontinue foley prior to vaginal delivery  . Initiate Carrier Fluid Protocol  . Initiate Oral Care Protocol  . Order Rapid HIV per protocol if no results on chart  . Patient may have epidural placement upon request  . Evaluate fetal heart rate to establish reassuring pattern prior to initiating Cytotec or Pitocin  . Perform a cervical exam prior to initiating Cytotec or Pitocin  . Discontinue Pitocin if tachysystole  with non-reassuring FHR is present  . Nofify MD/CNM if tachysystole with non-reassuring FHR is present  . Initiate intrauterine resuscitation if tachysystole with non-reasuring FHR is present  . If tachysystole WITH reassuring FHR present notify MD / CNM  . May administer Terbutaline 0.25 mg SQ x 1 dose if tachysystole with non-reassuring FHR is presesnt  . Labor Induction  . May use local infiltration of 1% lidocaine plain to produce a skin wheal prior to IV insertion  . Notify in-house Anesthesia team of nausea and vomiting greater  than 5 hours  . Assess for signs/symptoms of PIH/preeclampsia  . RN to place order for: CBC if one has not been drawn in the past 6 hours for all patients with hypertensive disease, pre-eclampsia, eclampsia, thrombocytopenia or previous PLTC<150,000.  . Identify to Anesthesia if patient plans to have postpartum tubal ligation; do not remove epidural without discussion with Anesthesiologist  . Vital signs following Epidural Placement, re-bolus or re-dose monitor patient's BP and oxygen saturation every 5 minutes for 30 minutes  . RN to remain at bedside continuously for 30 minutes post epidural placement, post re-bolus / re-dose  . Notify Anesthesia if the patient becomes short of breath or complains of heaviness in chest, chest pain, and/or unrelieved pain  . Notify Anesthesia prior to discontinuing epidural infusion  . Full code  . Type and screen [place]  . Insert and maintain IV Line  . Admit to Inpatient (patient's expected length of stay will be greater than 2 midnights or inpatient only procedure)   Meds ordered this encounter  Medications  . lactated ringers infusion  . oxytocin (PITOCIN) IV BOLUS FROM BAG  . oxytocin (PITOCIN) IV infusion 30 units in NS 500 mL - Premix  . lactated ringers infusion 500-1,000 mL  . acetaminophen (TYLENOL) tablet 650 mg  . oxyCODONE-acetaminophen (PERCOCET/ROXICET) 5-325 MG per tablet 1 tablet  . oxyCODONE-acetaminophen (PERCOCET/ROXICET) 5-325 MG per tablet 2 tablet  . ondansetron (ZOFRAN) injection 4 mg  . sodium citrate-citric acid (ORACIT) solution 30 mL  . lidocaine (PF) (XYLOCAINE) 1 % injection 30 mL  . fentaNYL (SUBLIMAZE) injection 50-100 mcg  . terbutaline (BRETHINE) injection 0.25 mg  . oxytocin (PITOCIN) IV infusion 30 units in NS 500 mL - Premix    Order Specific Question:   Begin infusion at:    Answer:   2 milli-units/min (2 mL/hr)    Order Specific Question:   Increase infusion by:    Answer:   2  milli-units/min (2 mL/hr)  . ePHEDrine injection 10 mg  . PHENYLephrine 40 mcg/ml in normal saline Adult IV Push Syringe (For Blood Pressure Support)  . lactated ringers infusion 500 mL  . fentaNYL 2 mcg/mL w/ bupivacaine 0.125% in NS 250 mL epidural infusion ([facility]ANES)  . diphenhydrAMINE (BENADRYL) injection 12.5 mg  . ePHEDrine injection 10 mg  . PHENYLephrine 40 mcg/ml in normal saline Adult IV Push Syringe (For Blood Pressure Support)  . misoprostol (CYTOTEC) tablet 25 mcg  . misoprostol (CYTOTEC) 25 MCG tablet    Nix, Susan   : cabinet override    Assessment/Plan: Tina [name] is a 29 y.o. female, [id number], IUP at 41 weeks, presenting for IOL for late term. Pt endorse + Fm. Denies vaginal leakage. Denies vaginal bleeding. Denies feeling cxt's. [place] with h/o anemia 9.9 at 28 weeks on iron. H/O anxiety and depression no meds. Late entry to [facility] at 14 weeks. Right arm has rods and screws in place. GBS-. Baby Female.  FWB: Cat 1 Fetal Tracing.   I discussed with patient risks, benefits and alternatives of labor induction including higher risk of cesarean delivery compared to spontaneous labor.  We discussed risks of induction agents including effects on fetal heart beat, contraction pattern and need for close monitoring.  Patient expressed understanding of all this and desired to proceed with the induction.  Risks and benefits of induction were reviewed, including failure of method, prolonged labor, need for further intervention, risk of cesarean.  Patient and family verbalized understanding and denies any further questions at this time. Pt and family wish to proceed with induction process. Discussed induction options of Cytotec, foley bulb, AROM, and pitocin were reviewed as well as risks and benefits with use of each discussed.  Plan: Admit to [place] per consult with [place] Routine [facility] orders Pain med/epidural prn Start induction with Cytotec.  H/O Anxiety/Depression: Monitor  mood and offer zoloft PP Anticipate labor progression   Noralyn Pick NP-C, CNM, MSN 01/02/2020, 12:24 PM

## 2020-01-02 NOTE — Progress Notes (Addendum)
Labor Progress Note  Tina [name] is a 29 y.o. female, [id number], IUP at 40 weeks, presenting for IOL for late term. Pt endorse + Fm. Denies vaginal leakage. Denies vaginal bleeding. Denies feeling cxt's. [place] with h/o anemia 9.9 at 28 weeks on iron. H/O anxiety and depression no meds. Late entry to [facility] at 14 weeks. Right arm has rods and screws in place. GBS-. Baby Female.  Subjective: Pt in bed in NAD feeling cxt but tolerating well. Partner at bedside and supportive. Discussed R/B/A with another Cytotec with her cxt pattern versus foley bulb, pt consented for foley bulb.  Patient Active Problem List   Diagnosis Date Noted  . Encounter for induction of labor 01/02/2020  . Transverse lie of fetus 12/01/2019  . Obesity in pregnancy 06/22/2019  . Supervision of other normal pregnancy, antepartum 05/22/2019  . Major depression, recurrent ([facility]) 10/12/2018  . MDD (major depressive disorder), recurrent episode, severe ([facility]) 10/11/2018   Objective: BP (!) 124/54   Pulse 94   Temp 98.4 F (36.9 C) (Oral)   Resp 16   Ht 5' 2.5" (1.588 m)   Wt 98.6 kg   LMP 04/09/2019 (Exact Date)   BMI 39.11 kg/m  No intake/output data recorded. No intake/output data recorded. NST: FHR baseline 150 bpm, Variability: moderate, Accelerations:present, Decelerations:  Absent= Cat 1/Reactive CTX:  irregular, every 2-3 minutes, lasting 50-90 seconds. Uterus gravid, soft non tender, mild to palpate with contractions.  SVE:  Dilation: 2 Effacement (%): 20 Station: Ballotable Exam by:: Calina Patrie Pitocin at (N/A) mUn/min  Cook cath foley bulb placed, posterior position but complete, uterine and vaginal balloon instilled with of NS, tolerated well.   Assessment:  Tina [name] is a 29 y.o. female, [id number], IUP at 52 weeks, presenting for IOL for late term. Pt endorse + Fm. Denies vaginal leakage. Denies vaginal bleeding. Denies feeling cxt's. [place] with h/o anemia 9.9 at 28 weeks on iron. H/O anxiety and  depression no meds. Late entry to [facility] at 14 weeks. Right arm has rods and screws in place. GBS-. Baby Female. Currently progressing in early labor at 2cm. Foley cook placed.  Patient Active Problem List   Diagnosis Date Noted  . Encounter for induction of labor 01/02/2020  . Transverse lie of fetus 12/01/2019  . Obesity in pregnancy 06/22/2019  . Supervision of other normal pregnancy, antepartum 05/22/2019  . Major depression, recurrent ([facility]) 10/12/2018  . MDD (major depressive disorder), recurrent episode, severe ([facility]) 10/11/2018   NICHD: Category 1  Membranes:  Intact, no s/s of infection  Induction:    Cytotec x2 @ 1146, 1528  Foley Bulb: Placed at 2000 on 10/5  Pitocin - N/A  Pain management:               IV pain management: xPRN Fentanyl             Epidural placement: PRN  GBS Negative  Plan: Continue labor plan Continuous monitoring Rest Ambulate Frequent position changes to facilitate fetal rotation and descent. RN Will reassess with cervical exam if necessary Discontinue Cytotec due to frequency of cxt.  Anticipate foley bulb out within 8 hour, the will AROM and start pitocin depending on cxt pattern.   Anticipate labor progression and vaginal delivery.  DR Richardson Dopp updated.   Dale [place], NP-C, CNM, MSN 01/02/2020. 8:33 PM

## 2020-01-02 NOTE — Progress Notes (Signed)
Labor Progress Note  Tina [name] is a 29 y.o. female, [id number], IUP at 33 weeks, presenting for IOL for late term. Pt endorse + Fm. Denies vaginal leakage. Denies vaginal bleeding. Denies feeling cxt's. [place] with h/o anemia 9.9 at 28 weeks on iron. H/O anxiety and depression no meds. Late entry to [facility] at 14 weeks. Right arm has rods and screws in place. GBS-. Baby Female.  Subjective: Pt in bed in NAD feeling cxt more intense now, requested IV fentanyl, reviewed pain management, pt declined epidural and desires all natural if possible. Partner at bedside and supportive. Discussed R/B/A of pitocin, but pt cxt on her own, therefore not indicated at this time.  Patient Active Problem List   Diagnosis Date Noted  . Encounter for induction of labor 01/02/2020  . Transverse lie of fetus 12/01/2019  . Obesity in pregnancy 06/22/2019  . Supervision of other normal pregnancy, antepartum 05/22/2019  . Major depression, recurrent ([facility]) 10/12/2018  . MDD (major depressive disorder), recurrent episode, severe (HCC) 10/11/2018   Objective: BP (!) 127/52 (BP Location: Left Arm)   Pulse 82   Temp 99 F (37.2 C) (Oral)   Resp 18   Ht 5' 2.5" (1.588 m)   Wt 98.6 kg   LMP 04/09/2019 (Exact Date)   BMI 39.11 kg/m  No intake/output data recorded. No intake/output data recorded. NST: FHR baseline 150 bpm, Variability: moderate, Accelerations:present, Decelerations:  Absent= Cat 1/Reactive CTX:  irregular, every 2-4 minutes, lasting 50-90 seconds. Uterus gravid, soft non tender, mild to palpate with contractions.  SVE:  Dilation: 2 Effacement (%): 20 Station: Ballotable Exam by:: Maizie Garno Pitocin at (N/A) mUn/min Bedside US verified vertex upon admission.   Cook cath foley bulb out with bloody show  Assessment:  Tina [name] is a 29 y.o. female, [id number], IUP at 23 weeks, presenting for IOL for late term. Pt endorse + Fm. Denies vaginal leakage. Denies vaginal bleeding. Denies feeling cxt's.  [place] with h/o anemia 9.9 at 28 weeks on iron. H/O anxiety and depression no meds. Late entry to [facility] at 14 weeks. Right arm has rods and screws in place. GBS-. Baby Female. Currently progressing in early labor at 2cm. Foley cook placed.  Patient Active Problem List   Diagnosis Date Noted  . Encounter for induction of labor 01/02/2020  . Transverse lie of fetus 12/01/2019  . Obesity in pregnancy 06/22/2019  . Supervision of other normal pregnancy, antepartum 05/22/2019  . Major depression, recurrent (HCC) 10/12/2018  . MDD (major depressive disorder), recurrent episode, severe (HCC) 10/11/2018   NICHD: Category 1  Membranes:  Intact, no s/s of infection  Induction:    Cytotec x2 @ 1146, 1528  Foley Bulb: Placed at 2000 on 10/5, out @ 2300  Pitocin - N/A  Pain management:               IV pain management: xPRN Fentanyl x1 dose @2146  on 10/5             Epidural placement: PRN  GBS Negative  Plan: Continue labor plan Continuous monitoring Rest Ambulate Frequent position changes to facilitate fetal rotation and descent. RN Will reassess with cervical exam if necessary Discontinue Cytotec due to frequency of cxt.  Foley bulb out and pt in regular cxt pattern, if cxt space may start low dose pitocin per protocol    Anticipate AROM when fetus in pelvis.  Anticipate labor progression and vaginal delivery.  , NP-C, CNM, MSN 01/02/2020. 11:00 PM

## 2020-01-03 ENCOUNTER — Inpatient Hospital Stay (HOSPITAL_COMMUNITY): Payer: 59 | Admitting: Anesthesiology

## 2020-01-03 DIAGNOSIS — D509 Iron deficiency anemia, unspecified: Secondary | ICD-10-CM | POA: Diagnosis present

## 2020-01-03 LAB — RPR: RPR Ser Ql: NONREACTIVE

## 2020-01-03 MED ORDER — TETANUS-DIPHTH-ACELL PERTUSSIS 5-2.5-18.5 LF-MCG/0.5 IM SUSP: 0.5000 mL | Freq: Once | INTRAMUSCULAR | Status: DC

## 2020-01-03 MED ORDER — ONDANSETRON HCL 4 MG/2ML IJ SOLN: 4.0000 mg | INTRAMUSCULAR | Status: DC | PRN

## 2020-01-03 MED ORDER — WITCH HAZEL-GLYCERIN EX PADS: 1.0000 "application " | MEDICATED_PAD | CUTANEOUS | Status: DC | PRN

## 2020-01-03 MED ORDER — POLYSACCHARIDE IRON COMPLEX 150 MG PO CAPS
150.0000 mg | ORAL_CAPSULE | Freq: Every day | ORAL | Status: DC
  Administered 2020-01-04: 150 mg via ORAL
  Filled 2020-01-03: qty 1

## 2020-01-03 MED ORDER — OXYTOCIN-SODIUM CHLORIDE 30-0.9 UT/500ML-% IV SOLN: 1.0000 m[IU]/min | INTRAVENOUS | Status: DC

## 2020-01-03 MED ORDER — SIMETHICONE 80 MG PO CHEW: 80.0000 mg | CHEWABLE_TABLET | ORAL | Status: DC | PRN

## 2020-01-03 MED ORDER — ACETAMINOPHEN 325 MG PO TABS: 650.0000 mg | ORAL_TABLET | ORAL | Status: DC | PRN

## 2020-01-03 MED ORDER — DIBUCAINE (PERIANAL) 1 % EX OINT: 1.0000 "application " | TOPICAL_OINTMENT | CUTANEOUS | Status: DC | PRN

## 2020-01-03 MED ORDER — DIPHENHYDRAMINE HCL 25 MG PO CAPS: 25.0000 mg | ORAL_CAPSULE | Freq: Four times a day (QID) | ORAL | Status: DC | PRN

## 2020-01-03 MED ORDER — ONDANSETRON HCL 4 MG PO TABS: 4.0000 mg | ORAL_TABLET | ORAL | Status: DC | PRN

## 2020-01-03 MED ORDER — SENNOSIDES-DOCUSATE SODIUM 8.6-50 MG PO TABS
2.0000 | ORAL_TABLET | ORAL | Status: DC
  Administered 2020-01-04 (×2): 2 via ORAL
  Filled 2020-01-03 (×2): qty 2

## 2020-01-03 MED ORDER — BENZOCAINE-MENTHOL 20-0.5 % EX AERO: 1.0000 "application " | INHALATION_SPRAY | CUTANEOUS | Status: DC | PRN

## 2020-01-03 MED ORDER — IBUPROFEN 600 MG PO TABS
600.0000 mg | ORAL_TABLET | Freq: Four times a day (QID) | ORAL | Status: DC
  Administered 2020-01-04: 600 mg via ORAL
  Filled 2020-01-03: qty 1

## 2020-01-03 MED ORDER — PRENATAL MULTIVITAMIN CH
1.0000 | ORAL_TABLET | Freq: Every day | ORAL | Status: DC
  Filled 2020-01-03: qty 1

## 2020-01-03 MED ORDER — COCONUT OIL OIL: 1.0000 "application " | TOPICAL_OIL | Status: DC | PRN

## 2020-01-03 MED ORDER — LIDOCAINE HCL (PF) 1 % IJ SOLN
INTRAMUSCULAR | Status: DC | PRN
  Administered 2020-01-03 (×2): 5 mL via EPIDURAL

## 2020-01-03 MED ORDER — FENTANYL CITRATE (PF) 2500 MCG/50ML IJ SOLN
INTRAMUSCULAR | Status: DC | PRN
Start: 2020-01-03 — End: 2020-01-03
  Administered 2020-01-03: 12 mL/h via EPIDURAL

## 2020-01-03 MED ORDER — MAGNESIUM OXIDE 400 (241.3 MG) MG PO TABS
400.0000 mg | ORAL_TABLET | Freq: Every day | ORAL | Status: DC
  Administered 2020-01-04: 400 mg via ORAL
  Filled 2020-01-03: qty 1

## 2020-01-03 NOTE — Progress Notes (Signed)
Labor Progress Note  Tina [name] is a 29 y.o. female, [id number], IUP at 54 weeks, presenting for IOL for late term. Pt endorse + Fm. Denies vaginal leakage. Denies vaginal bleeding. Denies feeling cxt's. [place] with h/o anemia 9.9 at 28 weeks on iron. H/O anxiety and depression no meds. Late entry to [facility] at 14 weeks. Right arm has rods and screws in place. GBS-. Baby Female.  Subjective: Pt in bed in NAD feeling cxt with supportive husband. Pt verbalized desire for PCS, recommended against as there is no medical induction, informed pt about time it take for inductions. Pt believes it this taking too long, informed pt she has a lot of fluid and once the baby has descended in the pelvis her water will be broken and she will progress more quickly. Encouraged pt to get epidural, pt verbalized consent to desire to continue with getting epidural and pitocin.  Patient Active Problem List   Diagnosis Date Noted  . Encounter for induction of labor 01/02/2020  . Transverse lie of fetus 12/01/2019  . Obesity in pregnancy 06/22/2019  . Supervision of other normal pregnancy, antepartum 05/22/2019  . Major depression, recurrent (HCC) 10/12/2018  . MDD (major depressive disorder), recurrent episode, severe (HCC) 10/11/2018   Objective: BP 108/62   Pulse 95   Temp 98.4 F (36.9 C) (Oral)   Resp 18   Ht 5' 2.5" (1.588 m)   Wt 98.6 kg   LMP 04/09/2019 (Exact Date)   BMI 39.11 kg/m  No intake/output data recorded. No intake/output data recorded. NST: FHR baseline 140 bpm, Variability: moderate, Accelerations:present, Decelerations:  Absent= Cat 1/Reactive CTX:  irregular, every 2-6 minutes, lasting 60-90 seconds. Uterus gravid, soft non tender, mild to palpate with contractions.  SVE:  Dilation: 3.5 Effacement (%): 50 Station: Ballotable Exam by:: [employer] Pitocin at (6) mUn/min Bedside US verified vertex upon admission.  Bedside US verified Vertex now   Assessment:  Tina [name] is a 29  y.o. female, [id number], IUP at 64 weeks, presenting for IOL for late term. Pt endorse + Fm. Denies vaginal leakage. Denies vaginal bleeding. Denies feeling cxt's. [place] with h/o anemia 9.9 at 28 weeks on iron. H/O anxiety and depression no meds. Late entry to [facility] at 14 weeks. Right arm has rods and screws in place. GBS-. Baby Female. Currently progressing in early labor at 2cm. Foley cook placed.  Patient Active Problem List   Diagnosis Date Noted  . Encounter for induction of labor 01/02/2020  . Transverse lie of fetus 12/01/2019  . Obesity in pregnancy 06/22/2019  . Supervision of other normal pregnancy, antepartum 05/22/2019  . Major depression, recurrent (HCC) 10/12/2018  . MDD (major depressive disorder), recurrent episode, severe (HCC) 10/11/2018   NICHD: Category 1  Membranes:  Intact, no s/s of infection  Induction:    Cytotec x2 @ 1146, 1528  Foley Bulb: Placed at 2000 on 10/5, out @ 2300  Pitocin - 6  Pain management:               IV pain management: xPRN Fentanyl x1 dose @2146  on 10/5, 0030 on 10/6             Epidural placement: PRN  GBS Negative  Plan: Continue labor plan Continuous monitoring Rest Epidural now.  Frequent position changes to facilitate fetal rotation and descent. RN Will reassess with cervical exam if necessary Discontinue Cytotec due to frequency of cxt.  Continue with pitocin 2x2 per protocol Anticipate AROM when fetus in pelvis.  Anticipate labor progression and vaginal delivery.  Dr Su Hilt and Maureen Ralphs CNM to assume care at 0700 and will will be given.   Dale [place], NP-C, CNM, MSN 01/03/2020. 6:20 AM

## 2020-01-03 NOTE — Progress Notes (Signed)
Subjective:    Resting, spouse present and supportive. Discussed AROM and pt agrees.  Objective:    VS: BP (!) 84/62   Pulse (!) 102   Temp 98.6 F (37 C) (Oral)   Resp 16   Ht 5' 2.5" (1.588 m)   Wt 98.6 kg   LMP 04/09/2019 (Exact Date)   SpO2 100%   BMI 39.11 kg/m  FHR : baseline 145 / variability moderate / accelerations present / absent decelerations Toco: contractions every 3-6 minutes w/ coupling Membranes: AROM, light meconium Dilation: 7 Effacement (%): 80 Cervical Position: Middle Station: -1 Presentation: Vertex Exam by:: Lizbeth Bark CNM Pitocin 18 mU/min  Assessment/Plan:   29 y.o. [id number] [redacted]w[redacted]d  IOL for dates S/P successful version @ 36wks  Labor: S/P Cytotec x2, cervical balloon, amniotomy performed w/ loght meconium noted Preeclampsia:  N/A Fetal Wellbeing:  Category I Pain Control:  Epidural I/D:  GBS neg Anticipated MOD:  NSVD  Roma Schanz MSN, CNM 01/03/2020 31:09 PM

## 2020-01-03 NOTE — Progress Notes (Signed)
Subjective:    Comfortable w/ epidural. Discussed plan and answered questions.   Objective:    VS: BP 109/62   Pulse 97   Temp 98.6 F (37 C) (Oral)   Resp 16   Ht 5' 2.5" (1.588 m)   Wt 98.6 kg   LMP 04/09/2019 (Exact Date)   SpO2 100%   BMI 39.11 kg/m  FHR : baseline 150 / variability moderate / accelerations present / absent decelerations Toco: contractions every 2-3 minutes  Membranes: Intact Dilation: 5 Effacement (%): 80 Cervical Position: Posterior Station: -2 Presentation: Vertex Exam by:: Lizbeth Bark CNM Pitocin 12 mU/min  Assessment/Plan:   29 y.o. [id number] [redacted]w[redacted]d IOL for dates S/P successful version @ 36wks  Labor: S/P Cytotec x2, cervical balloon, now progressing on Pitocin. Will cont to increase then AROM Preeclampsia:  N/A Fetal Wellbeing:  Category I Pain Control:  Epidural I/D:  GBS neg Anticipated MOD:  NSVD  Roma Schanz MSN, CNM 01/03/2020 10:13 AM

## 2020-01-03 NOTE — Anesthesia Preprocedure Evaluation (Signed)
Anesthesia Evaluation  Patient identified by MRN, date of birth, ID band Patient awake    Reviewed: Allergy & Precautions, H&P , NPO status , Patient's Chart, lab work & pertinent test results  History of Anesthesia Complications Negative for: history of anesthetic complications  Airway Mallampati: II  TM Distance: >3 FB Neck ROM: full    Dental no notable dental hx. (+) Teeth Intact   Pulmonary neg pulmonary ROS,    Pulmonary exam normal breath sounds clear to auscultation       Cardiovascular negative cardio ROS Normal cardiovascular exam Rhythm:regular Rate:Normal     Neuro/Psych PSYCHIATRIC DISORDERS Anxiety Depression negative neurological ROS     GI/Hepatic negative GI ROS, Neg liver ROS,   Endo/Other  negative endocrine ROS  Renal/GU negative Renal ROS  negative genitourinary   Musculoskeletal   Abdominal (+) + obese,   Peds  Hematology  (+) Blood dyscrasia, anemia ,   Anesthesia Other Findings   Reproductive/Obstetrics (+) Pregnancy                             Anesthesia Physical Anesthesia Plan  ASA: II  Anesthesia Plan: Epidural   Post-op Pain Management:    Induction:   PONV Risk Score and Plan:   Airway Management Planned:   Additional Equipment:   Intra-op Plan:   Post-operative Plan:   Informed Consent: I have reviewed the patients History and Physical, chart, labs and discussed the procedure including the risks, benefits and alternatives for the proposed anesthesia with the patient or authorized representative who has indicated his/her understanding and acceptance.       Plan Discussed with:   Anesthesia Plan Comments:         Anesthesia Quick Evaluation

## 2020-01-03 NOTE — Anesthesia Procedure Notes (Signed)
Epidural Patient location during procedure: OB Start time: 01/03/2020 7:26 AM End time: 01/03/2020 7:36 AM  Staffing Anesthesiologist: Leonides Grills, MD Performed: anesthesiologist   Preanesthetic Checklist Completed: patient identified, IV checked, site marked, risks and benefits discussed, monitors and equipment checked, pre-op evaluation and timeout performed  Epidural Patient position: sitting Prep: DuraPrep Patient monitoring: heart rate, cardiac monitor, continuous pulse ox and blood pressure Approach: midline Location: L4-L5 Injection technique: LOR air  Needle:  Needle type: Tuohy  Needle gauge: 17 G Needle length: 9 cm Needle insertion depth: 7 cm Catheter type: closed end flexible Catheter size: 19 Gauge Catheter at skin depth: 13 cm Test dose: negative and Other  Assessment Events: blood not aspirated, injection not painful, no injection resistance and negative IV test  Additional Notes Informed consent obtained prior to proceeding including risk of failure, 1% risk of PDPH, risk of minor discomfort and bruising.  Discussed alternatives to epidural analgesia and patient desires to proceed.  Timeout performed pre-procedure verifying patient name, procedure, and platelet count.  Patient tolerated procedure well. Reason for block:procedure for pain

## 2020-01-04 LAB — CBC
HCT: 29.9 % — ABNORMAL LOW (ref 36.0–46.0)
Hemoglobin: 9 g/dL — ABNORMAL LOW (ref 12.0–15.0)
MCH: 23.2 pg — ABNORMAL LOW (ref 26.0–34.0)
MCHC: 30.1 g/dL (ref 30.0–36.0)
MCV: 77.1 fL — ABNORMAL LOW (ref 80.0–100.0)
Platelets: 238 10*3/uL (ref 150–400)
RBC: 3.88 MIL/uL (ref 3.87–5.11)
RDW: 16.2 % — ABNORMAL HIGH (ref 11.5–15.5)
WBC: 17 10*3/uL — ABNORMAL HIGH (ref 4.0–10.5)
nRBC: 0 % (ref 0.0–0.2)

## 2020-01-04 MED ORDER — COMPLETENATE 29-1 MG PO CHEW
1.0000 | CHEWABLE_TABLET | Freq: Every day | ORAL | Status: DC
  Administered 2020-01-04: 1 via ORAL
  Filled 2020-01-04: qty 1

## 2020-01-04 MED ORDER — IBUPROFEN 100 MG/5ML PO SUSP
600.0000 mg | Freq: Four times a day (QID) | ORAL | Status: DC
  Administered 2020-01-04 – 2020-01-05 (×5): 600 mg via ORAL
  Filled 2020-01-04 (×5): qty 30

## 2020-01-04 NOTE — Anesthesia Postprocedure Evaluation (Signed)
Anesthesia Post Note  Patient: [name]  Procedure(s) Performed: AN AD HOC LABOR EPIDURAL     Patient location during evaluation: Mother Baby Anesthesia Type: Epidural Level of consciousness: awake and alert and oriented Pain management: satisfactory to patient Vital Signs Assessment: post-procedure vital signs reviewed and stable Respiratory status: spontaneous breathing and nonlabored ventilation Cardiovascular status: stable Postop Assessment: no headache, no backache, no signs of nausea or vomiting, adequate PO intake, patient able to bend at knees and able to ambulate (patient up walking) Anesthetic complications: no   No complications documented.  Last Vitals:  Vitals:   01/04/20 0607 01/04/20 0830  BP: 125/71 (!) 111/56  Pulse: 81 75  Resp: 18 18  Temp: 36.6 C 36.9 C  SpO2: 100% 99%    Last Pain:  Vitals:   01/04/20 0830  TempSrc: Oral  PainSc: 0-No pain   Pain Goal:                Epidural/Spinal Function Cutaneous sensation: Normal sensation (01/04/20 0830), Patient able to flex knees: Yes (01/04/20 0830), Patient able to lift hips off bed: Yes (01/04/20 0830), Back pain beyond tenderness at insertion site: No (01/04/20 0830), Progressively worsening motor and/or sensory loss: No (01/04/20 0830), Bowel and/or bladder incontinence post epidural: No (01/04/20 0830)  Tina [name]

## 2020-01-04 NOTE — Progress Notes (Signed)
Post Partum Day 1 Subjective: no complaints, up ad lib, voiding, tolerating PO and + flatus  Objective: Blood pressure (!) 111/56, pulse 75, temperature 98.4 F (36.9 C), temperature source Oral, resp. rate 18, height 5' 2.5" (1.588 m), weight 98.6 kg, last menstrual period 04/09/2019, SpO2 99 %.  Physical Exam:  General: alert, cooperative, appears stated age and no distress Lochia: appropriate Uterine Fundus: firm Incision: NA DVT Evaluation: No evidence of DVT seen on physical exam. Negative Homan's sign. No cords or calf tenderness. No significant calf/ankle edema.  Recent Labs    01/02/20 1130 01/04/20 0517  HGB 9.4* 9.0*  HCT 30.5* 29.9*    Assessment/Plan: Plan for discharge tomorrow, Lactation consult and Contraception undecided   LOS: 2 days   Tina [name] 01/04/2020, 9:21 AM

## 2020-01-04 NOTE — Lactation Note (Signed)
This note was copied from a baby's chart. Lactation Consultation Note  Patient Name: Tina [name] QIWLN'L Date: 01/04/2020 Reason for consult: Initial assessment;Term;Other (Comment);Infant weight loss (41 1/7 weeks, 2 % weight loss)  Baby is 15 hours old per mom only breast fed her 2nd baby for a few months.  LC reviewed and updated the doc flow sheets per mom and baby just finished the right breast 20 mins prior to the [facility] entering the room. Baby HNV.  Baby awake and showing some feeding cues.  LC offered to assist to latch on the left breast, football , and noted the left nipple to be semi inverted just at the base. Prior to latch [facility] showed mom reverse pressure and assisted to latch and assisted with positioning / depth.  Baby opened wide as baby was latching,  increased swallows with moist heat. Initially mom was feeling discomfort and as LC was compressing the breast Tissue per mom feeling better.  LC discussed the importance of STS feedings until the baby is back to birth weight,gaining steadily and can stay awake for majority of the feeding.  LC provided the hand pump with #24 F and #27 F and shells for between feedings except when sleeping.  LC especially recommended the pre- pump and reverse pressure on the left breast to prevent soreness, and to stretch the nipple / areola complex to enhance a deeper latch.  LC provided the [facility] pamphlet with [facility] resource phone numbers.  Per mom has a hand free Freemie at home.   Dad mentioned they may want to go home early and will discuss with doctors.       Maternal Data Has patient been taught Hand Expression?: Yes Does the patient have breastfeeding experience prior to this delivery?: Yes  Feeding Feeding Type: Breast Fed  LATCH Score Latch: Grasps breast easily, tongue down, lips flanged, rhythmical sucking.  Audible Swallowing: A few with stimulation (increased to 2)  Type of Nipple: Everted at rest and after  stimulation  Comfort (Breast/Nipple): Filling, red/small blisters or bruises, mild/mod discomfort  Hold (Positioning): Assistance needed to correctly position infant at breast and maintain latch.  LATCH Score: 7  Interventions Interventions: Breast feeding basics reviewed;Assisted with latch;Skin to skin;Breast massage;Hand express;Breast compression;Adjust position;Support pillows;Position options;Shells;Hand pump  Lactation Tools Discussed/Used Tools: Shells;Pump Shell Type: Inverted Breast pump type: Manual WIC Program: No Pump Review: Milk Storage   Consult Status Consult Status: Follow-up Date: 01/05/20 Follow-up type: In-patient    [name] 01/04/2020, 9:05 AM

## 2020-01-04 NOTE — Progress Notes (Signed)
CSW received consult for hx of Anxiety and Depression.  CSW met with MOB to offer support and complete assessment.    CSW congratulated MOB on the birth of infant. CSW advised MOB of CSW's of the HIPPA policy in which MOB expressed that it was fine for FOB to remain in the room while CSW spoke with her. CSW understanding and advised MOB of CSW role and the reason for CSW coming to speak with her. MOB expressed that she was diagnosed with anxiety and depression in July 2020. MOB reported that she was started on Effexor in which MOB expressed that she stopped taking due to pregnancy. MOB reported that she has plans to restart medication Post Partum. MOB reported that she is being seen at [facility] [place]. MOB reported no current therapy but expressed t hat she has resources and the ability to follow up as needed. 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 MOB identified feelings more related to herself that infant.  MOB reported no SI or HI and expressed that she has no other mental health hx.   MOB expressed that she has support from FOB and his family during this time. MOB informed CSW that she has all needed items to care for infant with plans for infant to sleep in crib once arrived home.    CSW provided education regarding the baby blues period vs. perinatal mood disorders, discussed treatment and gave resources for mental health follow up if concerns arise.  CSW recommends self-evaluation during the postpartum time period using the New Mom Checklist from Postpartum Progress and encouraged MOB to contact a medical professional if symptoms are noted at any time.   CSW provided review of Sudden Infant Death Syndrome (SIDS) precautions.    CSW identifies no further need for intervention and no barriers to discharge at this time.  Niva Murren S. Wilbert Schouten, MSW, LCSW [facility] [place] [phone]   

## 2020-01-05 MED ORDER — POLYSACCHARIDE IRON COMPLEX 150 MG PO CAPS: 150.0000 mg | ORAL_CAPSULE | Freq: Every day | ORAL | 1 refills | Status: DC

## 2020-01-05 MED ORDER — MAGNESIUM OXIDE 400 (241.3 MG) MG PO TABS
400.0000 mg | ORAL_TABLET | Freq: Every day | ORAL | 1 refills | Status: DC
Start: 2020-01-05 — End: 2021-12-06

## 2020-01-05 MED ORDER — IBUPROFEN 100 MG/5ML PO SUSP: 600.0000 mg | Freq: Four times a day (QID) | ORAL | 1 refills | Status: AC

## 2020-01-05 MED ORDER — ACETAMINOPHEN 325 MG PO TABS: 650.0000 mg | ORAL_TABLET | ORAL | 1 refills | Status: AC | PRN

## 2020-01-05 NOTE — Lactation Note (Signed)
This note was copied from a baby's chart. Lactation Consultation Note  Patient Name: Tina [name] HTDSK'A Date: 01/05/2020 Reason for consult: Follow-up assessment;Term  Follow up visit to 96 hours old infant with 4.61% weight loss of a P2 mother. Baby is sleeping in mother's arms upon arrival. Mother states breastfeeding is going well but she feels baby is not getting enough. Mother explains she started bottle-feeding formula as a supplement.   Reviewed with mother average size of a NB stomach and formula supplementation guidelines. Discussed pace bottle feeding benefits and demonstrated technique to prevent emesis. Encourage to follow babies' hunger and fullness cues. Reviewed importance to offer the breast 8 to 12 times in a 24-hour period for proper stimulation and to establish good milk supply. Reviewed signs of good milk transfer. Discussed milk coming to volume.Encouraged to contact Lactation services for support, questions or concerns.    All questions answered at this time. Mother is expecting to be discharged home today.   Maternal Data Formula Feeding for Exclusion: No  Interventions Interventions: Breast feeding basics reviewed  Lactation Tools Discussed/Used WIC Program: Yes Pump Review: Milk Storage   Consult Status Consult Status: Complete Date: 01/05/20 Follow-up type: Call as needed    [name] 01/05/2020, 8:39 AM

## 2020-01-05 NOTE — Discharge Summary (Signed)
OB Discharge Summary  Patient Name: Tina [name] DOB: 01/30/1991 MRN: [id number]  Date of admission: 01/02/2020 Delivering provider: GRICE, VIVIAN B   Admitting diagnosis: Encounter for induction of labor [Z34.90] Intrauterine pregnancy: [redacted]w[redacted]d     Secondary diagnosis: Patient Active Problem List   Diagnosis Date Noted  . Postpartum care following vaginal delivery 10/6 01/04/2020  . IDA (iron deficiency anemia) 01/03/2020  . SVD (10/6) 01/03/2020  . MDD (major depressive disorder), recurrent episode, severe (HCC) 10/11/2018   Additional problems: None   Date of discharge: 01/05/2020   Discharge diagnosis: Principal Problem:   Postpartum care following vaginal delivery 10/6 Active Problems:   MDD (major depressive disorder), recurrent episode, severe (HCC)   IDA (iron deficiency anemia)   SVD (10/6)                                                           Post partum procedures:None  Augmentation: AROM, Pitocin, Cytotec and IP Foley Pain control: Epidural  Laceration:None  Episiotomy:None  Complications: None  Hospital course:  Induction of Labor With Vaginal Delivery   29 y.o. yo G4P2012 at [redacted]w[redacted]d was admitted to the hospital 01/02/2020 for induction of labor.  Indication for induction: Postdates.  Patient had an uncomplicated labor course as follows: Membrane Rupture Time/Date: 1:01 PM ,01/03/2020   Delivery Method:Vaginal, Spontaneous  Episiotomy: None  Lacerations:  None  Details of delivery can be found in separate delivery note.  Patient had a routine postpartum course. Patient is discharged home 01/05/20.  Newborn Data: Birth date:01/03/2020  Birth time:5:09 PM  Gender:Female  Living status:Living  Apgars:9 ,9  Weight:3926 g   Physical exam  Vitals:   01/04/20 0830 01/04/20 1430 01/04/20 2130 01/05/20 0605  BP: (!) 111/56 137/80 123/70   Pulse: 75 83 74 70  Resp: 18 17 18   Temp: 98.4 F (36.9 C) 98.1 F (36.7 C) 98.4 F (36.9 C) 98 F (36.7 C)   TempSrc: Oral Oral Oral Oral  SpO2: 99% 99% 100% 97%  Weight:      Height:       General: alert, cooperative and no distress Lochia: appropriate Uterine Fundus: firm Perineum: intact DVT Evaluation: No evidence of DVT seen on physical exam. No significant calf/ankle edema. Labs: Lab Results  Component Value Date   WBC 17.0 (H) 01/04/2020   HGB 9.0 (L) 01/04/2020   HCT 29.9 (L) 01/04/2020   MCV 77.1 (L) 01/04/2020   PLT 238 01/04/2020   CMP Latest Ref Rng & Units 06/22/2019  Glucose 65 - 99 mg/dL 76  BUN 6 - 20 mg/dL 6  Creatinine 0.57 - 1.00 mg/dL 0.68  Sodium 134 - 144 mmol/L 136  Potassium 3.5 - 5.2 mmol/L 3.9  Chloride 96 - 106 mmol/L 104  CO2 20 - 29 mmol/L 19(L)  Calcium 8.7 - 10.2 mg/dL 9.3  Total Protein 6.0 - 8.5 g/dL 6.1  Total Bilirubin 0.0 - 1.2 mg/dL <0.2  Alkaline Phos 39 - 117 IU/L 57  AST 0 - 40 IU/L 11  ALT 0 - 32 IU/L 8   Edinburgh Postnatal Depression Scale Screening Tool 01/03/2020  I have been able to laugh and see the funny side of things. 1  I have looked forward with enjoyment to things. 1  I have blamed myself unnecessarily when things went   wrong. 2  I have been anxious or worried for no good reason. 0  I have felt scared or panicky for no good reason. 0  Things have been getting on top of me. 1  I have been so unhappy that I have had difficulty sleeping. 1  I have felt sad or miserable. 1  I have been so unhappy that I have been crying. 0  The thought of harming myself has occurred to me. 0  Edinburgh Postnatal Depression Scale Total 7   Vaccines: TDaP declined         Flu    declined  Discharge instruction:  per After Visit Summary  After Visit Meds:  Allergies as of 01/05/2020   No Known Allergies     Medication List    STOP taking these medications   Blood Pressure Kit Devi   ferrous sulfate 325 (65 FE) MG tablet   hydrOXYzine 10 MG tablet Commonly known as: ATARAX/VISTARIL   melatonin 5 MG Tabs   terconazole 0.8 %  vaginal cream Commonly known as: TERAZOL 3   venlafaxine XR 37.5 MG 24 hr capsule Commonly known as: EFFEXOR-XR     TAKE these medications   acetaminophen 325 MG tablet Commonly known as: Tylenol Take 2 tablets (650 mg total) by mouth every 4 (four) hours as needed (for pain scale < 4).   ibuprofen 100 MG/5ML suspension Commonly known as: ADVIL Take 30 mLs (600 mg total) by mouth every 6 (six) hours.   iron polysaccharides 150 MG capsule Commonly known as: Ferrex 150 Take 1 capsule (150 mg total) by mouth daily.   magnesium oxide 400 (241.3 Mg) MG tablet Commonly known as: MAG-OX Take 1 tablet (400 mg total) by mouth daily.   multivitamin-prenatal 27-0.8 MG Tabs tablet Take 1 tablet by mouth daily at 12 noon.            Discharge Care Instructions  (From admission, onward)         Start     Ordered   01/05/20 0000  Discharge wound care:       Comments: Sitz baths 2 times /day with warm water x 1 week. May add herbals: 1 ounce dried comfrey leaf* 1 ounce calendula flowers 1 ounce lavender flowers  Supplies can be found online at [employer] sources at [employer], [employer]  1/2 ounce dried uva ursi leaves 1/2 ounce witch hazel blossoms (if you can find them) 1/2 ounce dried sage leaf 1/2 cup sea salt Directions: Bring 2 quarts of water to a boil. Turn off heat, and place 1 ounce (approximately 1 large handful) of the above mixed herbs (not the salt) into the pot. Steep, covered, for 30 minutes.  Strain the liquid well with a fine mesh strainer, and discard the herb material. Add 2 quarts of liquid to the tub, along with the 1/2 cup of salt. This medicinal liquid can also be made into compresses and peri-rinses.   01/05/20 0702         Diet: routine diet  Activity: Advance as tolerated. Pelvic rest for 6 weeks.   Postpartum contraception: Undecided  Newborn Data: Live born female  Birth Weight: 8 lb 10.5 oz (3926 g) APGAR: 9,  9  Newborn Delivery   Birth date/time: 01/03/2020 17:09:00 Delivery type: Vaginal, Spontaneous     Named Morgan Baby Feeding: Bottle and Breast Disposition:home with mother  Delivery Report:  Review the Delivery Report for details.    Follow up:    Follow-up [facility]. Schedule an appointment as soon as possible for a visit in 6 week(s).   Specialty: Obstetrics and Gynecology Why: Please make an appointment for 6 weeks postpartum.  Contact information: [address] [place] [id number] [phone]             Signed: Zettie Pho, MSN 01/05/2020, 7:03 AM

## 2020-06-07 ENCOUNTER — Other Ambulatory Visit: Payer: Self-pay

## 2020-06-07 ENCOUNTER — Encounter (HOSPITAL_COMMUNITY): Payer: Self-pay

## 2020-06-07 ENCOUNTER — Ambulatory Visit (HOSPITAL_COMMUNITY)
Admission: EM | Admit: 2020-06-07 | Discharge: 2020-06-07 | Disposition: A | Discharge status: Home / Self Care | Payer: 59 | Attending: Emergency Medicine | Admitting: Emergency Medicine

## 2020-06-07 DIAGNOSIS — B349 Viral infection, unspecified: Secondary | ICD-10-CM | POA: Diagnosis not present

## 2020-06-07 DIAGNOSIS — U071 COVID-19: Secondary | ICD-10-CM | POA: Diagnosis not present

## 2020-06-07 DIAGNOSIS — R519 Headache, unspecified: Secondary | ICD-10-CM

## 2020-06-07 LAB — SARS CORONAVIRUS 2 (TAT 6-24 HRS): SARS Coronavirus 2: POSITIVE — AB

## 2020-06-07 NOTE — ED Provider Notes (Signed)
[facility]    CSN: [id number] Arrival date & time: 06/07/20  0830      History   Chief Complaint Chief Complaint  Patient presents with  . Chills  . Headache    HPI Tina [name] is a 30 y.o. female.   Patient presents with chills and headache since last night.  She denies fever, rash, sore throat, cough, shortness of breath, vomiting, diarrhea, or other symptoms.  No treatments attempted at home.  Her medical history includes depression, anxiety, anemia.  The history is provided by the patient and medical records.    Past Medical History:  Diagnosis Date  . Anemia   . Anxiety   . Depression   . Pregnant   . Tinnitus     Patient Active Problem List   Diagnosis Date Noted  . Postpartum care following vaginal delivery 10/6 01/04/2020  . IDA (iron deficiency anemia) 01/03/2020  . SVD (10/6) 01/03/2020  . MDD (major depressive disorder), recurrent episode, severe (HCC) 10/11/2018    Past Surgical History:  Procedure Laterality Date  . DILATION AND CURETTAGE OF UTERUS    . nasal cyst removal    . right arm surgery     Rod and screws placed    OB History    Gravida  4   Para  2   Term  2   Preterm  0   AB  1   Living  2     SAB  0   IAB  0   Ectopic  0   Multiple  0   Live Births  2            Home Medications    Prior to Admission medications   Medication Sig Start Date End Date Taking? Authorizing Provider  acetaminophen (TYLENOL) 325 MG tablet Take 2 tablets (650 mg total) by mouth every 4 (four) hours as needed (for pain scale < 4). 01/05/20   June Leap, CNM  ibuprofen (ADVIL) 100 MG/5ML suspension Take 30 mLs (600 mg total) by mouth every 6 (six) hours. 01/05/20   June Leap, CNM  iron polysaccharides (FERREX 150) 150 MG capsule Take 1 capsule (150 mg total) by mouth daily. 01/05/20   June Leap, CNM  magnesium oxide (MAG-OX) 400 (241.3 Mg) MG tablet Take 1 tablet (400 mg total) by mouth daily. 01/05/20    June Leap, CNM  Prenatal Vit-Fe Fumarate-FA (MULTIVITAMIN-PRENATAL) 27-0.8 MG TABS tablet Take 1 tablet by mouth daily at 12 noon.    [provider]    Family History Family History  Problem Relation Age of Onset  . Cancer Mother   . Obesity Mother   . Diabetes Father   . Hypertension Father     Social History Social History   Tobacco Use  . Smoking status: Never Smoker  . Smokeless tobacco: Never Used  Vaping Use  . Vaping Use: Never used  Substance Use Topics  . Alcohol use: No    Alcohol/week: 0.0 standard drinks  . Drug use: No     Allergies   Patient has no known allergies.   Review of Systems Review of Systems  Constitutional: Positive for chills. Negative for fever.  HENT: Negative for ear pain and sore throat.   Eyes: Negative for pain and visual disturbance.  Respiratory: Negative for cough and shortness of breath.   Cardiovascular: Negative for chest pain and palpitations.  Gastrointestinal: Negative for abdominal pain, diarrhea and vomiting.  Genitourinary: Negative  for dysuria and hematuria.  Musculoskeletal: Negative for arthralgias and back pain.  Skin: Negative for color change and rash.  Neurological: Positive for headaches. Negative for dizziness, syncope, weakness and numbness.  All other systems reviewed and are negative.    Physical Exam Triage Vital Signs ED Triage Vitals  Enc Vitals Group     BP      Pulse      Resp      Temp      Temp src      SpO2      Weight      Height      Head Circumference      Peak Flow      Pain Score      Pain Loc      Pain Edu?      Excl. in GC?    No data found.  Updated Vital Signs BP 115/77 (BP Location: Left Arm)   Pulse 85   Temp 98.6 F (37 C) (Oral)   Resp 17   LMP 05/05/2020   SpO2 96%   Visual Acuity Right Eye Distance:   Left Eye Distance:   Bilateral Distance:    Right Eye Near:   Left Eye Near:    Bilateral Near:     Physical Exam Vitals and nursing  note reviewed.  Constitutional:      General: She is not in acute distress.    Appearance: She is well-developed. She is not ill-appearing.  HENT:     Head: Normocephalic and atraumatic.     Right Ear: Tympanic membrane normal.     Left Ear: Tympanic membrane normal.     Nose: Nose normal.     Mouth/Throat:     Mouth: Mucous membranes are moist.     Pharynx: Oropharynx is clear.  Eyes:     Conjunctiva/sclera: Conjunctivae normal.  Cardiovascular:     Rate and Rhythm: Normal rate and regular rhythm.     Heart sounds: Normal heart sounds.  Pulmonary:     Effort: Pulmonary effort is normal. No respiratory distress.     Breath sounds: Normal breath sounds.  Abdominal:     Palpations: Abdomen is soft.     Tenderness: There is no abdominal tenderness.  Musculoskeletal:     Cervical back: Neck supple.  Skin:    General: Skin is warm and dry.     Findings: No rash.  Neurological:     General: No focal deficit present.     Mental Status: She is alert and oriented to person, place, and time.     Cranial Nerves: No cranial nerve deficit.     Sensory: No sensory deficit.     Motor: No weakness.     Gait: Gait normal.  Psychiatric:        Mood and Affect: Mood normal.        Behavior: Behavior normal.      UC Treatments / Results  Labs (all labs ordered are listed, but only abnormal results are displayed) Labs Reviewed  SARS CORONAVIRUS 2 (TAT 6-24 HRS)    EKG   Radiology No results found.  Procedures Procedures (including critical care time)  Medications Ordered in UC Medications - No data to display  Initial Impression / Assessment and Plan / UC Course  I have reviewed the triage vital signs and the nursing notes.  Pertinent labs & imaging results that were available during my care of the patient were reviewed by me and considered  in my medical decision making (see chart for details).   Acute non-intractable headache, viral illness.  Patient has not attempted  treatment with Tylenol or ibuprofen or other medications.  She declines medication here.  COVID pending.  Instructed patient to self quarantine until the test results are back.  Discussed symptomatic treatment including Tylenol, rest, hydration.  Instructed patient to follow up with PCP if her symptoms are not improving.  Patient agrees to plan of care.    Final Clinical Impressions(s) / UC Diagnoses   Final diagnoses:  Acute nonintractable headache, unspecified headache type  Viral illness     Discharge Instructions     Your COVID test is pending.  You should self quarantine until the test result is back.    Take Tylenol or ibuprofen as needed for fever or discomfort.  Rest and keep yourself hydrated.    Follow-up with your primary care provider if your symptoms are not improving.        ED Prescriptions    None     PDMP not reviewed this encounter.   Mickie Bail, NP 06/07/20 [phone]

## 2020-06-07 NOTE — Discharge Instructions (Signed)
Your COVID test is pending.  You should self quarantine until the test result is back.    Take Tylenol or ibuprofen as needed for fever or discomfort.  Rest and keep yourself hydrated.    Follow-up with your primary care provider if your symptoms are not improving.     

## 2020-06-07 NOTE — ED Triage Notes (Signed)
Pt presents with chills and headache since last night.

## 2020-06-08 ENCOUNTER — Telehealth: Payer: Self-pay | Admitting: Unknown Physician Specialty

## 2020-06-08 NOTE — Telephone Encounter (Signed)
Called to discuss with patient about COVID-19 symptoms and the use of one of the available treatments for those with mild to moderate Covid symptoms and at a high risk of hospitalization.  Pt appears to qualify for outpatient treatment due to co-morbid conditions and/or a member of an at-risk group in accordance with the FDA Emergency Use Authorization.    Symptom onset: 3/10  Unable to reach pt - Mychart and left message to call [phone]   [facility]

## 2021-04-29 ENCOUNTER — Other Ambulatory Visit: Payer: Self-pay | Admitting: Occupational Medicine

## 2021-04-29 ENCOUNTER — Other Ambulatory Visit: Payer: Self-pay

## 2021-04-29 ENCOUNTER — Ambulatory Visit: Payer: Self-pay

## 2021-04-29 DIAGNOSIS — M79631 Pain in right forearm: Secondary | ICD-10-CM

## 2021-06-21 IMAGING — US US MFM OB FOLLOW-UP
1 series · 13 of 28 positions shown · non-contrast
Comparison: none

[Series 1: us mfm ob follow-up · 73 acquisitions, 13 frames shown]
[im 3/73]
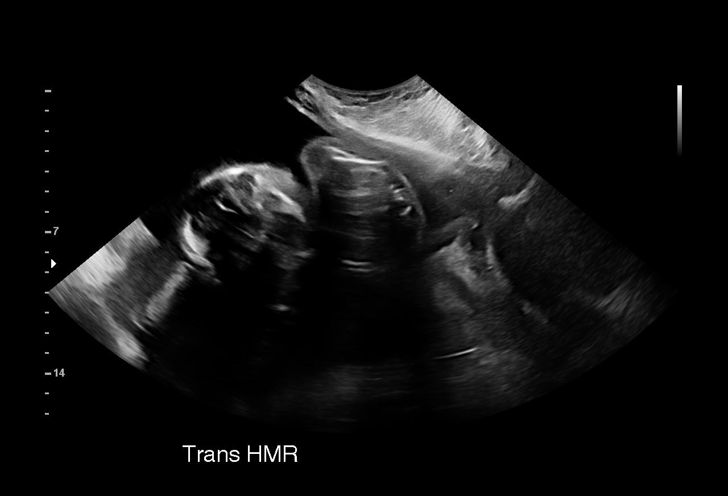
[im 9/73]
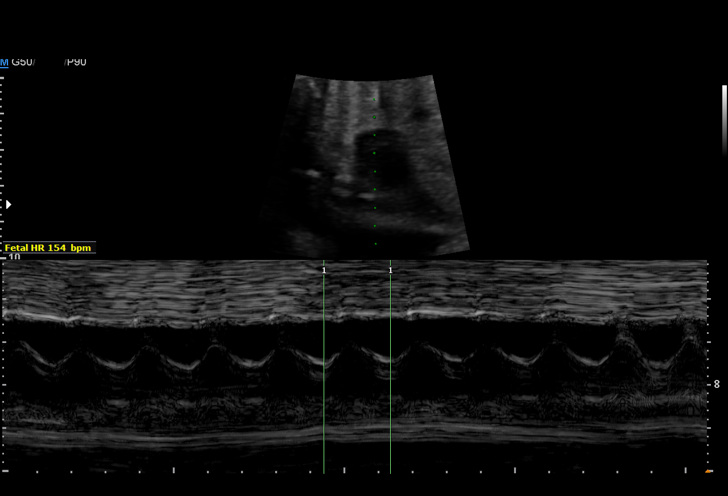
[im 14/73]
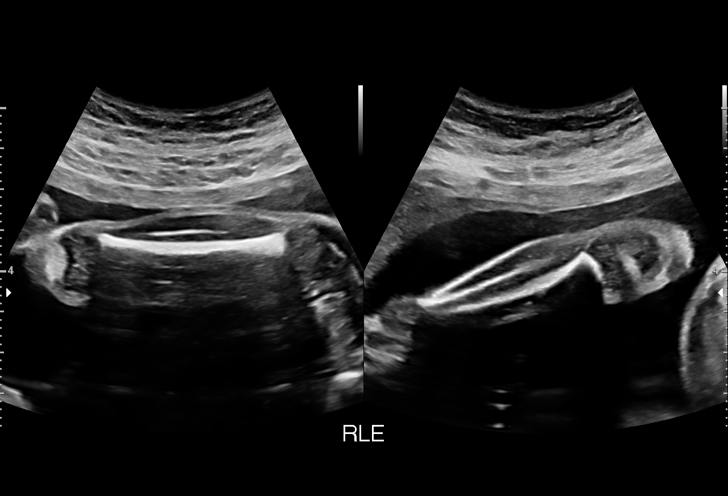
[im 19/73]
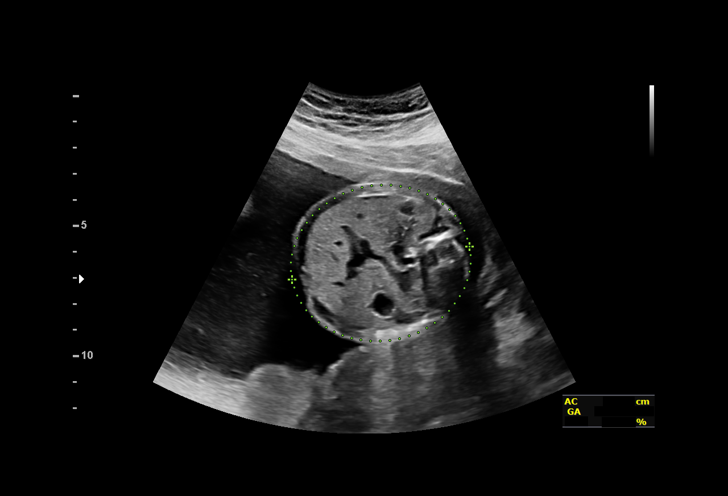
[im 25/73]
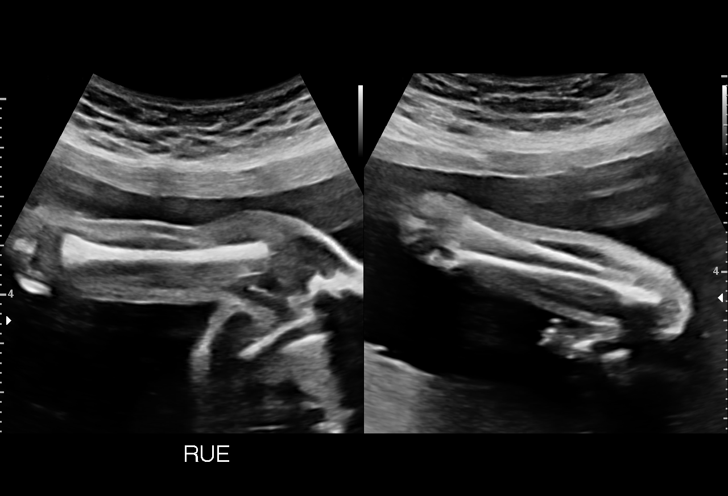
[im 30/73]
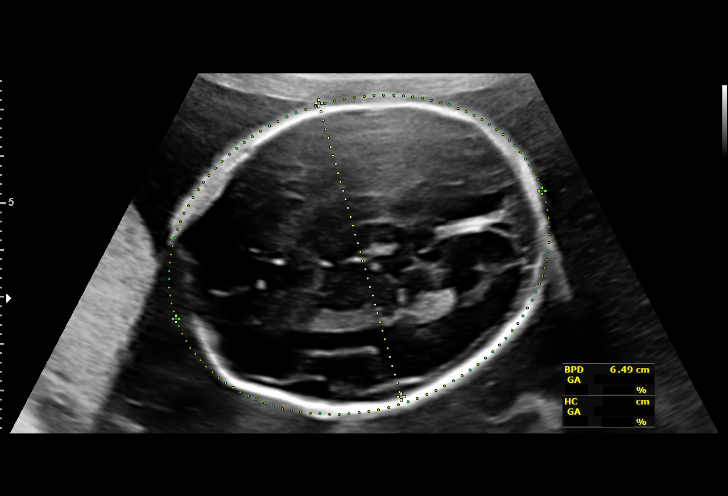
[im 38/73]
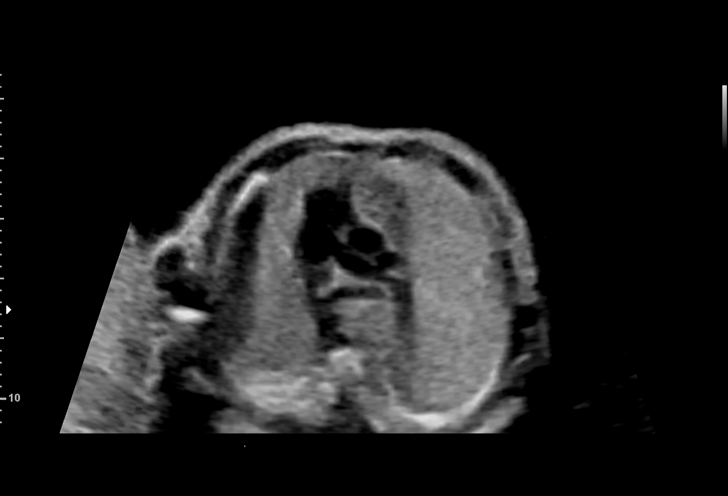
[im 43/73]
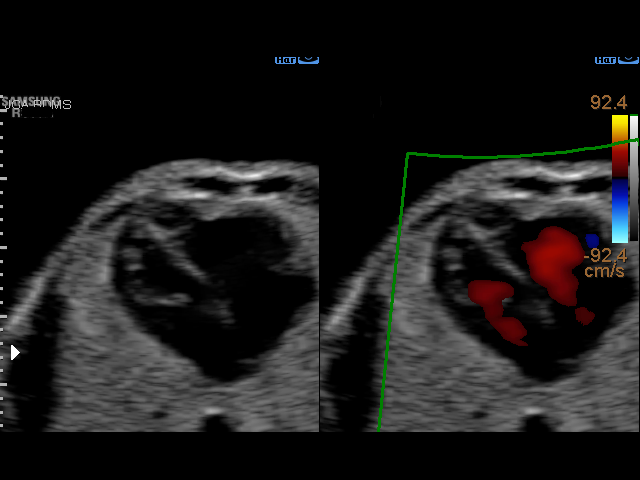
[im 49/73]
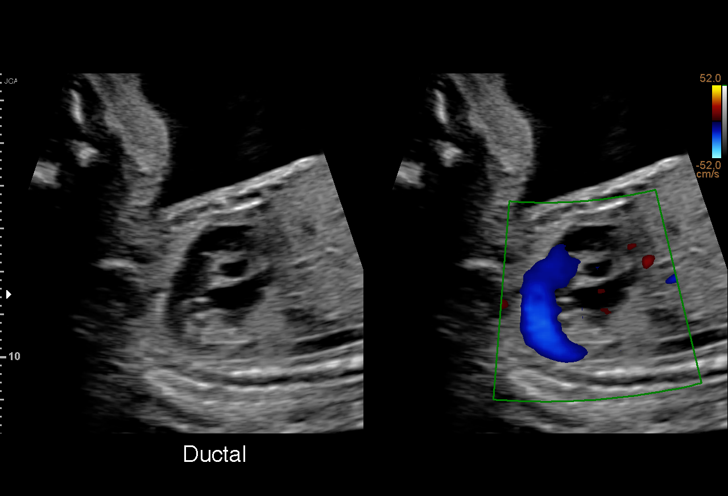
[im 54/73]
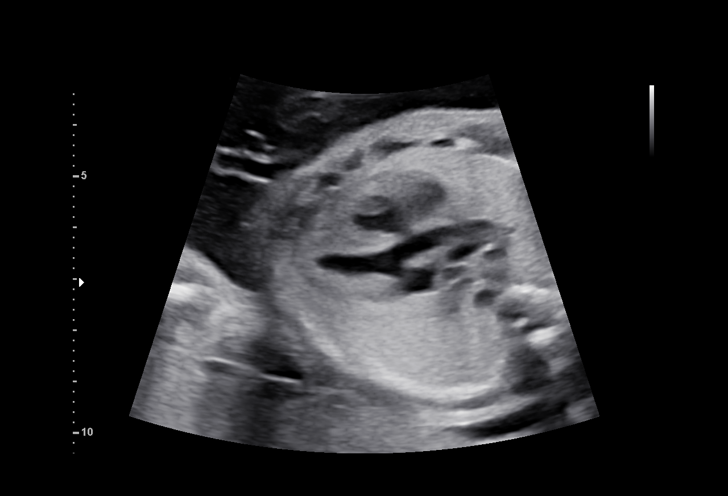
[im 59/73]
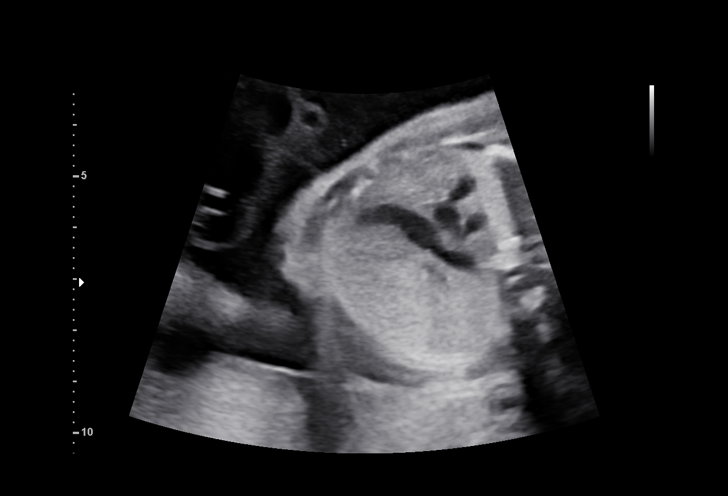
[im 65/73]
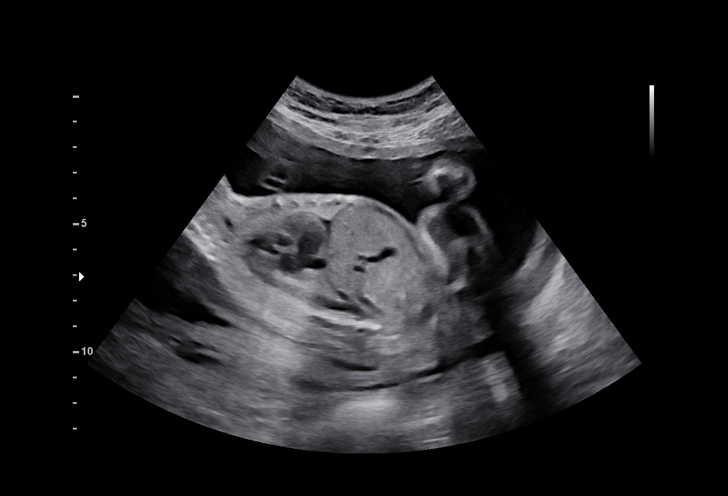
[im 70/73]
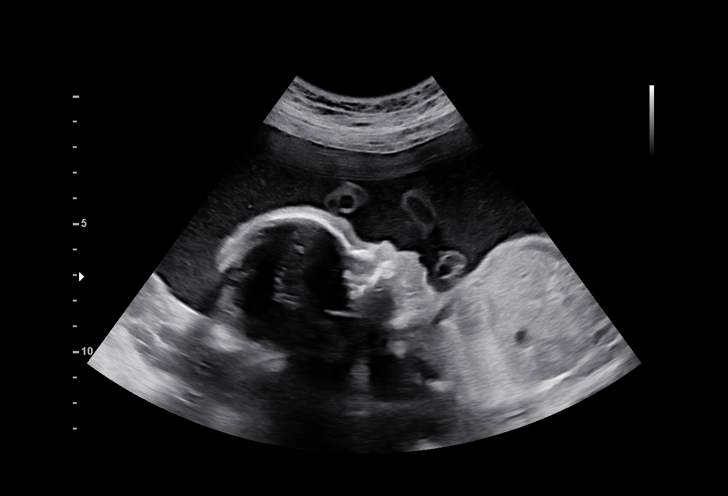

[13 of 28 positions shown; findings below may reference images not displayed]

DEL

Indications

 Encounter for other antenatal screening
 follow-up (Declined GT)
 25 weeks gestation of pregnancy
Fetal Evaluation

 Num Of Fetuses:         1
 Fetal Heart Rate(bpm):  154
 Cardiac Activity:       Observed
 Presentation:           Transverse, head to maternal right
 Placenta:               Posterior
 P. Cord Insertion:      Visualized

 Amniotic Fluid
 AFI FV:      Within normal limits

                             Largest Pocket(cm)

Biometry

 BPD:      64.8  mm     G. Age:  26w 1d         77  %    CI:         77.5   %    70 - 86
                                                         FL/HC:      19.7   %    18.7 -
 HC:       233   mm     G. Age:  25w 2d         35  %    HC/AC:      1.12        1.04 -
 AC:      207.6  mm     G. Age:  25w 2d         47  %    FL/BPD:     71.0   %    71 - 87
 FL:         46  mm     G. Age:  25w 2d         40  %    FL/AC:      22.2   %    20 - 24
 HUM:      42.9  mm     G. Age:  25w 5d         57  %

 LV:        3.8  mm

 Est. FW:     799  gm    1 lb 12 oz      49  %
OB History

 Gravidity:    4         Term:   2        Prem:   0        SAB:   0
 TOP:          0       Ectopic:  0        Living: 2
Gestational Age

 U/S Today:     25w 4d                                        EDD:   12/23/19
 Best:          25w 1d     Det. By:  U/S  (06/22/19)          EDD:   12/26/19
Anatomy

 Cranium:               Appears normal         Aortic Arch:            Appears normal
 Cavum:                 Appears normal         Ductal Arch:            Appears normal
 Ventricles:            Appears normal         Diaphragm:              Appears normal
 Choroid Plexus:        Appears normal         Stomach:                Appears normal, left
                                                                       sided
 Cerebellum:            Appears normal         Abdomen:                Appears normal
 Posterior Fossa:       Appears normal         Abdominal Wall:         Appears nml (cord
                                                                       insert, abd wall)
 Nuchal Fold:           Not applicable (>20    Cord Vessels:           Appears normal (3
                        wks GA)                                        vessel cord)
 Face:                  Appears normal         Kidneys:                Appear normal
                        (orbits and profile)
 Lips:                  Appears normal         Bladder:                Appears normal
 Thoracic:              Appears normal         Spine:                  Appears normal
 Heart:                 Appears normal         Upper Extremities:      Appears normal
                        (4CH, axis, and
                        situs)
 RVOT:                  Appears normal         Lower Extremities:      Appears normal
 LVOT:                  Appears normal

 Other:  Parents do not wish to know sex of fetus. Nasal bone visualized.
         Heels visualized.
Cervix Uterus Adnexa

 Cervix
 Length:           4.16  cm.
 Normal appearance by transabdominal scan.
Comments

 This patient was seen for a follow up exam as the views of
 the fetal anatomy were unable to be fully visualized during
 her last exam.  She denies any problems since her last exam.
 She was informed that the fetal growth and amniotic fluid
 level appears appropriate for her gestational age.
 The views of the fetal anatomy were visualized today.  There
 were no obvious anomalies noted.
 The limitations of ultrasound in the detection of all anomalies
 was discussed.
 Follow-up as indicated.

## 2021-11-21 ENCOUNTER — Ambulatory Visit
Admission: EM | Admit: 2021-11-21 | Discharge: 2021-11-21 | Disposition: A | Discharge status: Home / Self Care | Payer: 59

## 2021-11-21 DIAGNOSIS — U071 COVID-19: Secondary | ICD-10-CM

## 2021-11-21 MED ORDER — PROMETHAZINE-DM 6.25-15 MG/5ML PO SYRP: 5.0000 mL | ORAL_SOLUTION | Freq: Four times a day (QID) | ORAL | 0 refills | Status: AC | PRN

## 2021-11-21 NOTE — ED Provider Notes (Signed)
[facility]    CSN: [id number] Arrival date & time: 11/21/21  1628    HISTORY   Chief Complaint  Patient presents with   Nasal Congestion   Cough   Headache   COVID+   HPI Tina [name] is a pleasant, 31 y.o. female who presents to urgent care today. Patient complains of positive home COVID-19 test today.  Patient states she is having cough, headache, chills and congestion at this time.  Denies nausea, vomiting and diarrhea.  Patient states the cough is dry, nonproductive and wakes her up at night.  The history is provided by the patient.   Past Medical History:  Diagnosis Date   Anemia    Anxiety    Depression    Pregnant    Tinnitus    Patient Active Problem List   Diagnosis Date Noted   Postpartum care following vaginal delivery 10/6 01/04/2020   IDA (iron deficiency anemia) 01/03/2020   SVD (10/6) 01/03/2020   MDD (major depressive disorder), recurrent episode, severe (HCC) 10/11/2018   Past Surgical History:  Procedure Laterality Date   DILATION AND CURETTAGE OF UTERUS     nasal cyst removal     right arm surgery     Rod and screws placed   OB History     Gravida  4   Para  2   Term  2   Preterm  0   AB  1   Living  2      SAB  0   IAB  0   Ectopic  0   Multiple  0   Live Births  2          Home Medications    Prior to Admission medications   Medication Sig Start Date End Date Taking? Authorizing Provider  acetaminophen (TYLENOL) 325 MG tablet Take 2 tablets (650 mg total) by mouth every 4 (four) hours as needed (for pain scale < 4). 01/05/20   June Leap, CNM  ibuprofen (ADVIL) 100 MG/5ML suspension Take 30 mLs (600 mg total) by mouth every 6 (six) hours. 01/05/20   June Leap, CNM  magnesium oxide (MAG-OX) 400 (241.3 Mg) MG tablet Take 1 tablet (400 mg total) by mouth daily. 01/05/20   June Leap, CNM    Family History Family History  Problem Relation Age of Onset   Cancer Mother    Obesity Mother     Diabetes Father    Hypertension Father    Social History Social History   Tobacco Use   Smoking status: Never   Smokeless tobacco: Never  Vaping Use   Vaping Use: Never used  Substance Use Topics   Alcohol use: No    Alcohol/week: 0.0 standard drinks of alcohol   Drug use: No   Allergies   Patient has no known allergies.  Review of Systems Review of Systems Pertinent findings revealed after performing a 14 point review of systems has been noted in the history of present illness.  Physical Exam Triage Vital Signs ED Triage Vitals  Enc Vitals Group     BP 01/24/21 0827 (!) 147/82     Pulse Rate 01/24/21 0827 72     Resp 01/24/21 0827 18     Temp 01/24/21 0827 98.3 F (36.8 C)     Temp Source 01/24/21 0827 Oral     SpO2 01/24/21 0827 98 %     Weight --      Height --  Head Circumference --      Peak Flow --      Pain Score 01/24/21 0826 5     Pain Loc --      Pain Edu? --      Excl. in GC? --   No data found.  Updated Vital Signs BP 112/76 (BP Location: Left Arm)   Pulse 91   Temp 98.5 F (36.9 C) (Oral)   Resp 18   LMP 11/10/2021   SpO2 95%   Physical Exam Vitals and nursing note reviewed.  Constitutional:      General: She is not in acute distress.    Appearance: Normal appearance. She is not ill-appearing.  HENT:     Head: Normocephalic and atraumatic.     Salivary Glands: Right salivary gland is not diffusely enlarged or tender. Left salivary gland is not diffusely enlarged or tender.     Right Ear: Tympanic membrane, ear canal and external ear normal. No drainage. No middle ear effusion. There is no impacted cerumen. Tympanic membrane is not erythematous or bulging.     Left Ear: Tympanic membrane, ear canal and external ear normal. No drainage.  No middle ear effusion. There is no impacted cerumen. Tympanic membrane is not erythematous or bulging.     Nose: Nose normal. No nasal deformity, septal deviation, mucosal edema, congestion or  rhinorrhea.     Right Turbinates: Not enlarged, swollen or pale.     Left Turbinates: Not enlarged, swollen or pale.     Right Sinus: No maxillary sinus tenderness or frontal sinus tenderness.     Left Sinus: No maxillary sinus tenderness or frontal sinus tenderness.     Mouth/Throat:     Lips: Pink. No lesions.     Mouth: Mucous membranes are moist. No oral lesions.     Pharynx: Oropharynx is clear. Uvula midline. No posterior oropharyngeal erythema or uvula swelling.     Tonsils: No tonsillar exudate. 0 on the right. 0 on the left.  Eyes:     General: Lids are normal.        Right eye: No discharge.        Left eye: No discharge.     Extraocular Movements: Extraocular movements intact.     Conjunctiva/sclera: Conjunctivae normal.     Right eye: Right conjunctiva is not injected.     Left eye: Left conjunctiva is not injected.  Neck:     Trachea: Trachea and phonation normal.  Cardiovascular:     Rate and Rhythm: Normal rate and regular rhythm.     Pulses: Normal pulses.     Heart sounds: Normal heart sounds. No murmur heard.    No friction rub. No gallop.  Pulmonary:     Effort: Pulmonary effort is normal. No accessory muscle usage, prolonged expiration or respiratory distress.     Breath sounds: Normal breath sounds. No stridor, decreased air movement or transmitted upper airway sounds. No decreased breath sounds, wheezing, rhonchi or rales.  Chest:     Chest wall: No tenderness.  Musculoskeletal:        General: Normal range of motion.     Cervical back: Normal range of motion and neck supple. Normal range of motion.  Lymphadenopathy:     Cervical: No cervical adenopathy.  Skin:    General: Skin is warm and dry.     Findings: No erythema or rash.  Neurological:     General: No focal deficit present.     Mental Status: She  is alert and oriented to person, place, and time.  Psychiatric:        Mood and Affect: Mood normal.        Behavior: Behavior normal.     Visual  Acuity Right Eye Distance:   Left Eye Distance:   Bilateral Distance:    Right Eye Near:   Left Eye Near:    Bilateral Near:     UC Couse / Diagnostics / Procedures:     Radiology No results found.  Procedures Procedures (including critical care time) EKG  Pending results:  Labs Reviewed - No data to display  Medications Ordered in UC: Medications - No data to display  UC Diagnoses / Final Clinical Impressions(s)   I have reviewed the triage vital signs and the nursing notes.  Pertinent labs & imaging results that were available during my care of the patient were reviewed by me and considered in my medical decision making (see chart for details).    Final diagnoses:  COVID-19   Patient provided with a prescription for Promethazine DM and a note for work.  Return precautions advised.  ED Prescriptions     Medication Sig Dispense Auth. Provider   promethazine-dextromethorphan (PROMETHAZINE-DM) 6.25-15 MG/5ML syrup Take 5 mLs by mouth 4 (four) times daily as needed for cough. 118 mL Theadora Rama Scales, PA-C      PDMP not reviewed this encounter.  Disposition Upon Discharge:  Condition: stable for discharge home Home: take medications as prescribed; routine discharge instructions as discussed; follow up as advised.  Patient presented with an acute illness with associated systemic symptoms and significant discomfort requiring urgent management. In my opinion, this is a condition that a prudent lay person (someone who possesses an average knowledge of health and medicine) may potentially expect to result in complications if not addressed urgently such as respiratory distress, impairment of bodily function or dysfunction of bodily organs.   Routine symptom specific, illness specific and/or disease specific instructions were discussed with the patient and/or caregiver at length.   As such, the patient has been evaluated and assessed, work-up was performed and treatment  was provided in alignment with urgent care protocols and evidence based medicine.  Patient/parent/caregiver has been advised that the patient may require follow up for further testing and treatment if the symptoms continue in spite of treatment, as clinically indicated and appropriate.  If the patient was tested for COVID-19, Influenza and/or RSV, then the patient/parent/guardian was advised to isolate at home pending the results of his/her diagnostic coronavirus test and potentially longer if they're positive. I have also advised pt that if his/her COVID-19 test returns positive, it's recommended to self-isolate for at least 10 days after symptoms first appeared AND until fever-free for 24 hours without fever reducer AND other symptoms have improved or resolved. Discussed self-isolation recommendations as well as instructions for household member/close contacts as per the [facility] and [place] DHHS, and also gave patient the COVID packet with this information.  Patient/parent/caregiver has been advised to return to the [facility] or PCP in 3-5 days if no better; to PCP or the Emergency Department if new signs and symptoms develop, or if the current signs or symptoms continue to change or worsen for further workup, evaluation and treatment as clinically indicated and appropriate  The patient will follow up with their current PCP if and as advised. If the patient does not currently have a PCP we will assist them in obtaining one.   The patient may need specialty follow  up if the symptoms continue, in spite of conservative treatment and management, for further workup, evaluation, consultation and treatment as clinically indicated and appropriate.  Patient/parent/caregiver verbalized understanding and agreement of plan as discussed.  All questions were addressed during visit.  Please see discharge instructions below for further details of plan.  Discharge Instructions:   Discharge Instructions      Please read the  enclosed information regarding COVID-19.  I hope you find this helpful.  I have sent a prescription for Promethazine DM to your pharmacy that you can take for cough as needed.  You are welcome to continue ibuprofen and Tylenol as needed as well.      This office note has been dictated using Teaching laboratory technician.  Unfortunately, this method of dictation can sometimes lead to typographical or grammatical errors.  I apologize for your inconvenience in advance if this occurs.  Please do not hesitate to reach out to me if clarification is needed.      Theadora Rama Scales, [place] 11/21/21 [phone]

## 2021-11-21 NOTE — Discharge Instructions (Addendum)
Please read the enclosed information regarding COVID-19.  I hope you find this helpful.  I have sent a prescription for Promethazine DM to your pharmacy that you can take for cough as needed.  You are welcome to continue ibuprofen and Tylenol as needed as well.

## 2021-11-21 NOTE — ED Triage Notes (Signed)
The patient states she tested positive for covid at home today.   Symptoms: cough, headache, congestion  Started: 3 days ago

## 2021-12-06 ENCOUNTER — Ambulatory Visit
Admission: EM | Admit: 2021-12-06 | Discharge: 2021-12-06 | Disposition: A | Discharge status: Home / Self Care | Payer: 59 | Attending: Emergency Medicine | Admitting: Emergency Medicine

## 2021-12-06 ENCOUNTER — Encounter: Payer: Self-pay | Admitting: *Deleted

## 2021-12-06 DIAGNOSIS — J309 Allergic rhinitis, unspecified: Secondary | ICD-10-CM

## 2021-12-06 MED ORDER — FLUTICASONE PROPIONATE 50 MCG/ACT NA SUSP: 1.0000 | Freq: Every day | NASAL | 2 refills | Status: AC

## 2021-12-06 NOTE — Discharge Instructions (Signed)
For sneezing and runny nose, I sent a prescription for Flonase to your pharmacy.  Please spray 1 spray in each nare daily.  In the next 3 to 5 days you should have significant improvement of your postnasal drip, sneezing and tickly cough.  If you begin to experience fever, sinus pain or pressure, ear pain, cough productive of green sputum, please return for repeat evaluation.  Thank you for visiting urgent care today.

## 2021-12-06 NOTE — ED Triage Notes (Signed)
Pt reports testing positive for Covid 11/21/21 - states sxs had been improving, but then started 2 days ago with HA again, nasal congestion, sore throat, continued cough. No known fevers.

## 2021-12-06 NOTE — ED Provider Notes (Signed)
[facility]    CSN: [id number] Arrival date & time: 12/06/21  1426    HISTORY  No chief complaint on file.  HPI Tina [name] is a pleasant, 31 y.o. female who presents to urgent care today. Pt reports testing positive for Covid 11/21/21 - states sxs had been improving, but then started 2 days ago with HA again, nasal congestion, sore throat, continued cough. No known fever, nausea, vomiting, diarrhea, body aches, chills.  Patient has normal vital signs on arrival today.    Past Medical History:  Diagnosis Date   Anemia    Anxiety    Depression    Pregnant    Tinnitus    Patient Active Problem List   Diagnosis Date Noted   Postpartum care following vaginal delivery 10/6 01/04/2020   IDA (iron deficiency anemia) 01/03/2020   SVD (10/6) 01/03/2020   MDD (major depressive disorder), recurrent episode, severe (HCC) 10/11/2018   Past Surgical History:  Procedure Laterality Date   DILATION AND CURETTAGE OF UTERUS     nasal cyst removal     right arm surgery     Rod and screws placed   OB History     Gravida  4   Para  2   Term  2   Preterm  0   AB  1   Living  2      SAB  0   IAB  0   Ectopic  0   Multiple  0   Live Births  2          Home Medications    Prior to Admission medications   Medication Sig Start Date End Date Taking? Authorizing Provider  promethazine-dextromethorphan (PROMETHAZINE-DM) 6.25-15 MG/5ML syrup Take 5 mLs by mouth 4 (four) times daily as needed for cough. 11/21/21  Yes Theadora Rama Scales, PA-C  venlafaxine XR (EFFEXOR-XR) 150 MG 24 hr capsule Take 150 mg by mouth daily. 11/10/21  Yes [provider]  venlafaxine XR (EFFEXOR-XR) 37.5 MG 24 hr capsule Take 37.5 mg by mouth daily. 11/20/21  Yes [provider]  acetaminophen (TYLENOL) 325 MG tablet Take 2 tablets (650 mg total) by mouth every 4 (four) hours as needed (for pain scale < 4). 01/05/20   June Leap, CNM  ibuprofen (ADVIL) 100  MG/5ML suspension Take 30 mLs (600 mg total) by mouth every 6 (six) hours. 01/05/20   June Leap, CNM  magnesium oxide (MAG-OX) 400 (241.3 Mg) MG tablet Take 1 tablet (400 mg total) by mouth daily. 01/05/20   June Leap, CNM    Family History Family History  Problem Relation Age of Onset   Cancer Mother    Obesity Mother    Diabetes Father    Hypertension Father    Social History Social History   Tobacco Use   Smoking status: Never   Smokeless tobacco: Never  Vaping Use   Vaping Use: Never used  Substance Use Topics   Alcohol use: No    Alcohol/week: 0.0 standard drinks of alcohol   Drug use: No   Allergies   Patient has no known allergies.  Review of Systems Review of Systems Pertinent findings revealed after performing a 14 point review of systems has been noted in the history of present illness.  Physical Exam Triage Vital Signs ED Triage Vitals  Enc Vitals Group     BP 01/24/21 0827 (!) 147/82     Pulse Rate 01/24/21 0827 72     Resp  01/24/21 0827 18     Temp 01/24/21 0827 98.3 F (36.8 C)     Temp Source 01/24/21 0827 Oral     SpO2 01/24/21 0827 98 %     Weight --      Height --      Head Circumference --      Peak Flow --      Pain Score 01/24/21 0826 5     Pain Loc --      Pain Edu? --      Excl. in GC? --   No data found.  Updated Vital Signs BP 115/80   Pulse 82   Temp 98.1 F (36.7 C) (Oral)   Resp 16   LMP 11/10/2021 (Approximate)   SpO2 99%   Physical Exam Vitals and nursing note reviewed.  Constitutional:      General: She is not in acute distress.    Appearance: Normal appearance. She is not ill-appearing.  HENT:     Head: Normocephalic and atraumatic.     Salivary Glands: Right salivary gland is not diffusely enlarged or tender. Left salivary gland is not diffusely enlarged or tender.     Right Ear: Ear canal and external ear normal. No drainage. A middle ear effusion is present. There is no impacted cerumen. Tympanic  membrane is bulging. Tympanic membrane is not injected or erythematous.     Left Ear: Ear canal and external ear normal. No drainage. A middle ear effusion is present. There is no impacted cerumen. Tympanic membrane is bulging. Tympanic membrane is not injected or erythematous.     Ears:     Comments: Bilateral EACs normal, both TMs bulging with clear fluid    Nose: Rhinorrhea present. No nasal deformity, septal deviation, signs of injury, nasal tenderness, mucosal edema or congestion. Rhinorrhea is clear.     Right Nostril: Occlusion present. No foreign body, epistaxis or septal hematoma.     Left Nostril: Occlusion present. No foreign body, epistaxis or septal hematoma.     Right Turbinates: Enlarged, swollen and pale.     Left Turbinates: Enlarged, swollen and pale.     Right Sinus: No maxillary sinus tenderness or frontal sinus tenderness.     Left Sinus: No maxillary sinus tenderness or frontal sinus tenderness.     Mouth/Throat:     Lips: Pink. No lesions.     Mouth: Mucous membranes are moist. No oral lesions.     Pharynx: Oropharynx is clear. Uvula midline. No posterior oropharyngeal erythema or uvula swelling.     Tonsils: No tonsillar exudate. 0 on the right. 0 on the left.     Comments: Postnasal drip Eyes:     General: Lids are normal.        Right eye: No discharge.        Left eye: No discharge.     Extraocular Movements: Extraocular movements intact.     Conjunctiva/sclera: Conjunctivae normal.     Right eye: Right conjunctiva is not injected.     Left eye: Left conjunctiva is not injected.  Neck:     Trachea: Trachea and phonation normal.  Cardiovascular:     Rate and Rhythm: Normal rate and regular rhythm.     Pulses: Normal pulses.     Heart sounds: Normal heart sounds. No murmur heard.    No friction rub. No gallop.  Pulmonary:     Effort: Pulmonary effort is normal. No accessory muscle usage, prolonged expiration or respiratory distress.  Breath sounds: Normal  breath sounds. No stridor, decreased air movement or transmitted upper airway sounds. No decreased breath sounds, wheezing, rhonchi or rales.  Chest:     Chest wall: No tenderness.  Musculoskeletal:        General: Normal range of motion.     Cervical back: Normal range of motion and neck supple. Normal range of motion.  Lymphadenopathy:     Cervical: No cervical adenopathy.  Skin:    General: Skin is warm and dry.     Findings: No erythema or rash.  Neurological:     General: No focal deficit present.     Mental Status: She is alert and oriented to person, place, and time.  Psychiatric:        Mood and Affect: Mood normal.        Behavior: Behavior normal.     Visual Acuity Right Eye Distance:   Left Eye Distance:   Bilateral Distance:    Right Eye Near:   Left Eye Near:    Bilateral Near:     UC Couse / Diagnostics / Procedures:     Radiology No results found.  Procedures Procedures (including critical care time) EKG  Pending results:  Labs Reviewed - No data to display  Medications Ordered in UC: Medications - No data to display  UC Diagnoses / Final Clinical Impressions(s)   I have reviewed the triage vital signs and the nursing notes.  Pertinent labs & imaging results that were available during my care of the patient were reviewed by me and considered in my medical decision making (see chart for details).    Final diagnoses:  Allergic rhinitis, unspecified seasonality, unspecified trigger   Patient advised that symptoms of physical exam findings are concerning for allergic rhinitis.  Patient provided with Flonase for relief of sneezing and runny nose.  Return precautions advised.  ED Prescriptions     Medication Sig Dispense Auth. Provider   fluticasone (FLONASE) 50 MCG/ACT nasal spray Place 1 spray into both nostrils daily. Begin by using 2 sprays in each nare daily for 3 to 5 days, then decrease to 1 spray in each nare daily. 15.8 mL Theadora Rama  Scales, PA-C      PDMP not reviewed this encounter.  Disposition Upon Discharge:  Condition: stable for discharge home Home: take medications as prescribed; routine discharge instructions as discussed; follow up as advised.  Patient presented with an acute illness with associated systemic symptoms and significant discomfort requiring urgent management. In my opinion, this is a condition that a prudent lay person (someone who possesses an average knowledge of health and medicine) may potentially expect to result in complications if not addressed urgently such as respiratory distress, impairment of bodily function or dysfunction of bodily organs.   Routine symptom specific, illness specific and/or disease specific instructions were discussed with the patient and/or caregiver at length.   As such, the patient has been evaluated and assessed, work-up was performed and treatment was provided in alignment with urgent care protocols and evidence based medicine.  Patient/parent/caregiver has been advised that the patient may require follow up for further testing and treatment if the symptoms continue in spite of treatment, as clinically indicated and appropriate.  If the patient was tested for COVID-19, Influenza and/or RSV, then the patient/parent/guardian was advised to isolate at home pending the results of his/her diagnostic coronavirus test and potentially longer if they're positive. I have also advised pt that if his/her COVID-19 test returns positive, it's  recommended to self-isolate for at least 10 days after symptoms first appeared AND until fever-free for 24 hours without fever reducer AND other symptoms have improved or resolved. Discussed self-isolation recommendations as well as instructions for household member/close contacts as per the [facility] and [place] DHHS, and also gave patient the COVID packet with this information.  Patient/parent/caregiver has been advised to return to the [facility] or PCP in 3-5  days if no better; to PCP or the Emergency Department if new signs and symptoms develop, or if the current signs or symptoms continue to change or worsen for further workup, evaluation and treatment as clinically indicated and appropriate  The patient will follow up with their current PCP if and as advised. If the patient does not currently have a PCP we will assist them in obtaining one.   The patient may need specialty follow up if the symptoms continue, in spite of conservative treatment and management, for further workup, evaluation, consultation and treatment as clinically indicated and appropriate.  Patient/parent/caregiver verbalized understanding and agreement of plan as discussed.  All questions were addressed during visit.  Please see discharge instructions below for further details of plan.  Discharge Instructions:   Discharge Instructions      For sneezing and runny nose, I sent a prescription for Flonase to your pharmacy.  Please spray 1 spray in each nare daily.  In the next 3 to 5 days you should have significant improvement of your postnasal drip, sneezing and tickly cough.  If you begin to experience fever, sinus pain or pressure, ear pain, cough productive of green sputum, please return for repeat evaluation.  Thank you for visiting urgent care today.      This office note has been dictated using Teaching laboratory technician.  Unfortunately, this method of dictation can sometimes lead to typographical or grammatical errors.  I apologize for your inconvenience in advance if this occurs.  Please do not hesitate to reach out to me if clarification is needed.      Theadora Rama Scales, PA-C 12/06/21 1539

## 2023-01-12 ENCOUNTER — Other Ambulatory Visit: Payer: Self-pay

## 2023-01-12 ENCOUNTER — Ambulatory Visit: Payer: 59 | Attending: Obstetrics and Gynecology | Admitting: Physical Therapy

## 2023-01-12 DIAGNOSIS — R293 Abnormal posture: Secondary | ICD-10-CM | POA: Insufficient documentation

## 2023-01-12 DIAGNOSIS — R279 Unspecified lack of coordination: Secondary | ICD-10-CM | POA: Diagnosis present

## 2023-01-12 DIAGNOSIS — M6281 Muscle weakness (generalized): Secondary | ICD-10-CM | POA: Diagnosis present

## 2023-01-12 DIAGNOSIS — M62838 Other muscle spasm: Secondary | ICD-10-CM | POA: Insufficient documentation
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
## 2023-01-12 NOTE — Patient Instructions (Addendum)
Bladder Irritants  Certain foods and beverages can be irritating to the bladder.  Avoiding these irritants may decrease your symptoms of urinary urgency, frequency or bladder pain.  Even reducing your intake can help with your symptoms.  Not everyone is sensitive to all bladder irritants, so you may consider focusing on one irritant at a time, removing or reducing your intake of that irritant for 7-10 days to see if this change helps your symptoms.  Water intake is also very important.  Below is a list of bladder irritants.  Drinks: alcohol, carbonated beverages, caffeinated beverages such as coffee and tea, drinks with artificial sweeteners, citrus juices, apple juice, tomato juice  Foods: tomatoes and tomato based foods, spicy food, sugar and artificial sweeteners, vinegar, chocolate, raw onion, apples, citrus fruits, pineapple, cranberries, tomatoes, strawberries, plums, peaches, cantaloupe  Other: acidic urine (too concentrated) - see water intake info below  Substitutes you can try that are NOT irritating to the bladder: cooked onion, pears, papayas, sun-brewed decaf teas, watermelons, non-citrus herbal teas, apricots, kava and low-acid instant drinks (Postum).    WATER INTAKE: Remember to drink lots of water (aim for fluid intake of half your body weight with 2/3 of fluids being water).  You may be limiting fluids due to fear of leakage, but this can actually worsen urgency symptoms due to highly concentrated urine.  Water helps balance the pH of your urine so it doesn't become too acidic - acidic urine is a bladder irritant!   Urge Incontinence  Ideal urination frequency is every 2-4 wakeful hours, which equates to 5-8 times within a 24-hour period.   Urge incontinence is leakage that occurs when the bladder muscle contracts, creating a sudden need to go before getting to the bathroom.   Going too often when your bladder isn't actually full can disrupt the body's automatic signals to  store and hold urine longer, which will increase urgency/frequency.  In this case, the bladder "is running the show" and strategies can be learned to retrain this pattern.   One should be able to control the first urge to urinate, at around .  The bladder can hold up to a "grande latte," or . To help you gain control, practice the Urge Drill below when urgency strikes.  This drill will help retrain your bladder signals and allow you to store and hold urine longer.  The overall goal is to stretch out your time between voids to reach a more manageable voiding schedule.    Practice your "quick flicks" often throughout the day (each waking hour) even when you don't need feel the urge to go.  This will help strengthen your pelvic floor muscles, making them more effective in controlling leakage.  Urge Drill  When you feel an urge to go, follow these steps to regain control: Stop what you are doing and be still Take one deep breath, directing your air into your abdomen Think an affirming thought, such as "I've got this." Do 5 quick flicks of your pelvic floor Walk with control to the bathroom to void, or delay voiding    Do for 5 mins while laying down, do around the same time daily. Ex. In am and pm

## 2023-01-19 ENCOUNTER — Ambulatory Visit: Payer: 59 | Admitting: Physical Therapy

## 2023-01-26 ENCOUNTER — Encounter: Payer: 59 | Admitting: Physical Therapy

## 2023-02-02 ENCOUNTER — Telehealth: Payer: Self-pay | Admitting: Physical Therapy

## 2023-02-02 ENCOUNTER — Ambulatory Visit: Payer: 59 | Attending: Obstetrics and Gynecology | Admitting: Physical Therapy

## 2023-02-02 DIAGNOSIS — M62838 Other muscle spasm: Secondary | ICD-10-CM | POA: Insufficient documentation

## 2023-02-02 DIAGNOSIS — R293 Abnormal posture: Secondary | ICD-10-CM | POA: Insufficient documentation

## 2023-02-02 DIAGNOSIS — M6281 Muscle weakness (generalized): Secondary | ICD-10-CM | POA: Insufficient documentation

## 2023-02-02 DIAGNOSIS — R279 Unspecified lack of coordination: Secondary | ICD-10-CM | POA: Insufficient documentation

## 2023-02-02 NOTE — Telephone Encounter (Signed)
PT called pt about this morning's appointment at 1530. Pt did not answer, voicemail left.   This is pt's first no show for an appointment.   Otelia Sergeant, PT, DPT 11/05/244:17 PM

## 2023-03-18 ENCOUNTER — Encounter: Payer: 59 | Admitting: Physical Therapy

## 2023-04-08 ENCOUNTER — Encounter: Payer: 59 | Admitting: Physical Therapy

## 2023-04-13 ENCOUNTER — Encounter: Payer: 59 | Admitting: Physical Therapy

## 2023-04-13 ENCOUNTER — Telehealth: Payer: Self-pay | Admitting: Physical Therapy

## 2023-04-13 NOTE — Telephone Encounter (Signed)
 PT called pt about this morning's appointment at 0715. Pt did not answer, voicemail left.    This is pt's second no show for appointment and due to clinic's attendance policy, pt will unfortunately be discharged from PT.   Darryle Navy, PT, DPT 01/14/258:36 AM

## 2023-04-22 ENCOUNTER — Encounter: Payer: 59 | Admitting: Physical Therapy

## 2023-04-22 ENCOUNTER — Telehealth: Payer: 59 | Admitting: Physician Assistant

## 2023-04-22 DIAGNOSIS — S025XXA Fracture of tooth (traumatic), initial encounter for closed fracture: Secondary | ICD-10-CM

## 2023-04-22 MED ORDER — NAPROXEN 500 MG PO TABS: 500.0000 mg | ORAL_TABLET | Freq: Two times a day (BID) | ORAL | 0 refills | Status: AC

## 2023-04-22 MED ORDER — PENICILLIN V POTASSIUM 500 MG PO TABS: 500.0000 mg | ORAL_TABLET | Freq: Three times a day (TID) | ORAL | 0 refills | Status: AC

## 2023-04-22 NOTE — Progress Notes (Signed)
E-Visit for Dental Pain  We are sorry that you are not feeling well.  Here is how we plan to help!  Based on what you have shared with me in the questionnaire, it sounds like you have fractured tooth. Do keep that appointment with your dentist as scheduled already.I am prescribing two medications as follows:  Pen VK 500mg  3 times a day for 7 days and Naprosyn 500mg  2 times a day for 7 days for discomfort, but may take Naprosyn for longer duration as needed for pain. I will also send a work excuse note as requested.   It is imperative that you see a dentist within 10 days of this eVisit to determine the cause of the dental pain and be sure it is adequately treated  A toothache or tooth pain is caused when the nerve in the root of a tooth or surrounding a tooth is irritated. Dental (tooth) infection, decay, injury, or loss of a tooth are the most common causes of dental pain. Pain may also occur after an extraction (tooth is pulled out). Pain sometimes originates from other areas and radiates to the jaw, thus appearing to be tooth pain.Bacteria growing inside your mouth can contribute to gum disease and dental decay, both of which can cause pain. A toothache occurs from inflammation of the central portion of the tooth called pulp. The pulp contains nerve endings that are very sensitive to pain. Inflammation to the pulp or pulpitis may be caused by dental cavities, trauma, and infection.    HOME CARE:   For toothaches: Over-the-counter pain medications such as acetaminophen or ibuprofen may be used. Take these as directed on the package while you arrange for a dental appointment. Avoid very cold or hot foods, because they may make the pain worse. You may get relief from biting on a cotton ball soaked in oil of cloves. You can get oil of cloves at most drug stores.  For jaw pain:  Aspirin may be helpful for problems in the joint of the jaw in adults. If pain happens every time you open your mouth  widely, the temporomandibular joint (TMJ) may be the source of the pain. Yawning or taking a large bite of food may worsen the pain. An appointment with your doctor or dentist will help you find the cause.     GET HELP RIGHT AWAY IF:  You have a high fever or chills If you have had a recent head or face injury and develop headache, light headedness, nausea, vomiting, or other symptoms that concern you after an injury to your face or mouth, you could have a more serious injury in addition to your dental injury. A facial rash associated with a toothache: This condition may improve with medication. Contact your doctor for them to decide what is appropriate. Any jaw pain occurring with chest pain: Although jaw pain is most commonly caused by dental disease, it is sometimes referred pain from other areas. People with heart disease, especially people who have had stents placed, people with diabetes, or those who have had heart surgery may have jaw pain as a symptom of heart attack or angina. If your jaw or tooth pain is associated with lightheadedness, sweating, or shortness of breath, you should see a doctor as soon as possible. Trouble swallowing or excessive pain or bleeding from gums: If you have a history of a weakened immune system, diabetes, or steroid use, you may be more susceptible to infections. Infections can often be more severe and  extensive or caused by unusual organisms. Dental and gum infections in people with these conditions may require more aggressive treatment. An abscess may need draining or IV antibiotics, for example.  MAKE SURE YOU   Understand these instructions. Will watch your condition. Will get help right away if you are not doing well or get worse.  Thank you for choosing an e-visit.  Your e-visit answers were reviewed by a board certified advanced clinical practitioner to complete your personal care plan. Depending upon the condition, your plan could have included both  over the counter or prescription medications.  Please review your pharmacy choice. Make sure the pharmacy is open so you can pick up prescription now. If there is a problem, you may contact your provider through [employer] and have the prescription routed to another pharmacy.  Your safety is important to [place]. If you have drug allergies check your prescription carefully.   For the next 24 hours you can use MyChart to ask questions about today's visit, request a non-urgent call back, or ask for a work or school excuse. You will get an email in the next two days asking about your experience. I hope that your e-visit has been valuable and will speed your recovery.   I have spent 5 minutes in review of e-visit questionnaire, review and updating patient chart, medical decision making and response to patient.   Gilberto Better, PA-C

## 2023-04-29 ENCOUNTER — Encounter: Payer: 59 | Admitting: Physical Therapy

## 2023-05-10 ENCOUNTER — Encounter: Payer: 59 | Admitting: Physical Therapy
